# Patient Record
Sex: Male | Born: 1985 | Race: White | Hispanic: No | Marital: Single | Smoking: Current every day smoker
Health system: Southern US, Community
[De-identification: ages and names within clinical notes are randomized; demographics above are authoritative.]

## PROBLEM LIST (undated history)

## (undated) DIAGNOSIS — M5030 Other cervical disc degeneration, unspecified cervical region: Secondary | ICD-10-CM

## (undated) DIAGNOSIS — M5020 Other cervical disc displacement, unspecified cervical region: Secondary | ICD-10-CM

## (undated) DIAGNOSIS — T148XXA Other injury of unspecified body region, initial encounter: Secondary | ICD-10-CM

## (undated) HISTORY — PX: CERVICAL FUSION: SHX112

---

## 2004-05-02 ENCOUNTER — Emergency Department: Payer: Self-pay | Admitting: Emergency Medicine

## 2008-12-28 ENCOUNTER — Emergency Department: Payer: Self-pay | Admitting: Emergency Medicine

## 2009-06-15 ENCOUNTER — Emergency Department: Payer: Self-pay | Admitting: Internal Medicine

## 2009-09-22 ENCOUNTER — Emergency Department: Payer: Self-pay | Admitting: Emergency Medicine

## 2010-08-14 ENCOUNTER — Emergency Department: Payer: Self-pay | Admitting: Internal Medicine

## 2011-05-28 ENCOUNTER — Emergency Department: Payer: Self-pay | Admitting: Emergency Medicine

## 2011-10-16 ENCOUNTER — Emergency Department: Payer: Self-pay | Admitting: Emergency Medicine

## 2013-08-20 ENCOUNTER — Emergency Department: Payer: Self-pay | Admitting: Emergency Medicine

## 2013-08-23 ENCOUNTER — Emergency Department: Payer: Self-pay | Admitting: Emergency Medicine

## 2013-10-14 ENCOUNTER — Emergency Department: Payer: Self-pay | Admitting: Emergency Medicine

## 2013-10-14 LAB — URINALYSIS, COMPLETE
BACTERIA: NONE SEEN
Bilirubin,UR: NEGATIVE
Blood: NEGATIVE
Glucose,UR: NEGATIVE mg/dL (ref 0–75)
Ketone: NEGATIVE
LEUKOCYTE ESTERASE: NEGATIVE
Nitrite: NEGATIVE
PH: 5 (ref 4.5–8.0)
Protein: 30
RBC,UR: 1 /HPF (ref 0–5)
Specific Gravity: 1.032 (ref 1.003–1.030)

## 2013-10-14 LAB — GC/CHLAMYDIA PROBE AMP

## 2014-01-26 ENCOUNTER — Emergency Department: Payer: Self-pay | Admitting: Emergency Medicine

## 2014-01-26 LAB — URINALYSIS, COMPLETE
BILIRUBIN, UR: NEGATIVE
Bacteria: NONE SEEN
Blood: NEGATIVE
Glucose,UR: NEGATIVE mg/dL (ref 0–75)
KETONE: NEGATIVE
LEUKOCYTE ESTERASE: NEGATIVE
Nitrite: NEGATIVE
Ph: 5 (ref 4.5–8.0)
Protein: NEGATIVE
RBC,UR: NONE SEEN /HPF (ref 0–5)
SQUAMOUS EPITHELIAL: NONE SEEN
Specific Gravity: 1.008 (ref 1.003–1.030)

## 2014-03-25 ENCOUNTER — Emergency Department: Payer: Self-pay | Admitting: Emergency Medicine

## 2014-03-25 LAB — URINALYSIS, COMPLETE
BACTERIA: NONE SEEN
BLOOD: NEGATIVE
Bilirubin,UR: NEGATIVE
Ketone: NEGATIVE
Leukocyte Esterase: NEGATIVE
Nitrite: NEGATIVE
Ph: 5 (ref 4.5–8.0)
Protein: NEGATIVE
Specific Gravity: 1.012 (ref 1.003–1.030)
Squamous Epithelial: NONE SEEN
WBC UR: <1 /HPF (ref 0–5)

## 2014-04-04 ENCOUNTER — Emergency Department: Payer: Self-pay | Admitting: Emergency Medicine

## 2014-04-04 LAB — URINALYSIS, COMPLETE
BILIRUBIN, UR: NEGATIVE
Bacteria: NONE SEEN
Blood: NEGATIVE
Glucose,UR: NEGATIVE mg/dL (ref 0–75)
KETONE: NEGATIVE
Leukocyte Esterase: NEGATIVE
NITRITE: NEGATIVE
Ph: 5 (ref 4.5–8.0)
Protein: NEGATIVE
RBC,UR: <1 /HPF (ref 0–5)
Specific Gravity: 1.021 (ref 1.003–1.030)
WBC UR: <1 /HPF (ref 0–5)

## 2014-04-04 LAB — CBC WITH DIFFERENTIAL/PLATELET
Basophil #: 0.1 x10 3/mm 3
Basophil %: 0.9 %
Eosinophil #: 0.2 x10 3/mm 3
Eosinophil %: 2.2 %
HCT: 38.5 % — ABNORMAL LOW
HGB: 12.2 g/dL — ABNORMAL LOW
Lymphocyte %: 32.5 %
Lymphs Abs: 3 x10 3/mm 3
MCH: 20 pg — ABNORMAL LOW
MCHC: 31.6 g/dL — ABNORMAL LOW
MCV: 63 fL — ABNORMAL LOW
Monocyte #: 0.9 "x10 3/mm "
Monocyte %: 9.7 %
Neutrophil #: 5.1 x10 3/mm 3
Neutrophil %: 54.7 %
Platelet: 336 x10 3/mm 3
RBC: 6.1 x10 6/mm 3 — ABNORMAL HIGH
RDW: 14.1 %
WBC: 9.4 x10 3/mm 3

## 2014-04-04 LAB — COMPREHENSIVE METABOLIC PANEL
ALBUMIN: 4.3 g/dL (ref 3.4–5.0)
ALK PHOS: 94 U/L
ALT: 29 U/L
ANION GAP: 8 (ref 7–16)
BILIRUBIN TOTAL: 0.6 mg/dL (ref 0.2–1.0)
BUN: 8 mg/dL (ref 7–18)
CALCIUM: 8.7 mg/dL (ref 8.5–10.1)
CREATININE: 0.97 mg/dL (ref 0.60–1.30)
Chloride: 106 mmol/L (ref 98–107)
Co2: 26 mmol/L (ref 21–32)
EGFR (African American): >60
EGFR (Non-African Amer.): >60
Glucose: 78 mg/dL (ref 65–99)
OSMOLALITY: 277 (ref 275–301)
POTASSIUM: 2.9 mmol/L — AB (ref 3.5–5.1)
SGOT(AST): 20 U/L (ref 15–37)
Sodium: 140 mmol/L (ref 136–145)
Total Protein: 7.3 g/dL (ref 6.4–8.2)

## 2014-04-08 ENCOUNTER — Emergency Department: Payer: Self-pay | Admitting: Emergency Medicine

## 2014-04-08 LAB — URINALYSIS, COMPLETE
Bacteria: NONE SEEN
Bilirubin,UR: NEGATIVE
Blood: NEGATIVE
GLUCOSE, UR: NEGATIVE mg/dL (ref 0–75)
Ketone: NEGATIVE
Leukocyte Esterase: NEGATIVE
NITRITE: NEGATIVE
Ph: 6 (ref 4.5–8.0)
Protein: NEGATIVE
SPECIFIC GRAVITY: 1.011 (ref 1.003–1.030)
Squamous Epithelial: <1
WBC UR: 3 /HPF (ref 0–5)

## 2014-04-08 LAB — CBC WITH DIFFERENTIAL/PLATELET
Basophil #: 0.1 10*3/uL (ref 0.0–0.1)
Basophil %: 0.8 %
Eosinophil #: 0.2 10*3/uL (ref 0.0–0.7)
Eosinophil %: 1.7 %
HCT: 37.5 % — ABNORMAL LOW (ref 40.0–52.0)
HGB: 11.8 g/dL — ABNORMAL LOW (ref 13.0–18.0)
Lymphocyte #: 3.2 10*3/uL (ref 1.0–3.6)
Lymphocyte %: 29.6 %
MCH: 19.9 pg — AB (ref 26.0–34.0)
MCHC: 31.4 g/dL — ABNORMAL LOW (ref 32.0–36.0)
MCV: 63 fL — AB (ref 80–100)
MONO ABS: 0.6 x10 3/mm (ref 0.2–1.0)
MONOS PCT: 5.4 %
NEUTROS PCT: 62.5 %
Neutrophil #: 6.8 10*3/uL — ABNORMAL HIGH (ref 1.4–6.5)
PLATELETS: 350 10*3/uL (ref 150–440)
RBC: 5.93 10*6/uL — ABNORMAL HIGH (ref 4.40–5.90)
RDW: 14.3 % (ref 11.5–14.5)
WBC: 10.9 10*3/uL — AB (ref 3.8–10.6)

## 2014-04-08 LAB — COMPREHENSIVE METABOLIC PANEL
ALK PHOS: 81 U/L
Albumin: 4 g/dL (ref 3.4–5.0)
Anion Gap: 5 — ABNORMAL LOW (ref 7–16)
BUN: 8 mg/dL (ref 7–18)
Bilirubin,Total: 0.8 mg/dL (ref 0.2–1.0)
Calcium, Total: 8.6 mg/dL (ref 8.5–10.1)
Chloride: 107 mmol/L (ref 98–107)
Co2: 27 mmol/L (ref 21–32)
Creatinine: 0.91 mg/dL (ref 0.60–1.30)
EGFR (African American): >60
Glucose: 109 mg/dL — ABNORMAL HIGH (ref 65–99)
Osmolality: 276 (ref 275–301)
Potassium: 3.4 mmol/L — ABNORMAL LOW (ref 3.5–5.1)
SGOT(AST): 22 U/L (ref 15–37)
SGPT (ALT): 33 U/L
Sodium: 139 mmol/L (ref 136–145)
Total Protein: 6.9 g/dL (ref 6.4–8.2)

## 2015-02-14 ENCOUNTER — Emergency Department
Admission: EM | Admit: 2015-02-14 | Discharge: 2015-02-14 | Disposition: A | Discharge status: Home / Self Care | Payer: Self-pay | Attending: Emergency Medicine | Admitting: Emergency Medicine

## 2015-02-14 DIAGNOSIS — M546 Pain in thoracic spine: Secondary | ICD-10-CM | POA: Insufficient documentation

## 2015-02-14 DIAGNOSIS — M545 Low back pain, unspecified: Secondary | ICD-10-CM

## 2015-02-14 MED ORDER — CYCLOBENZAPRINE HCL 10 MG PO TABS: 10.0000 mg | ORAL_TABLET | Freq: Three times a day (TID) | ORAL | Status: DC | PRN

## 2015-02-14 MED ORDER — IBUPROFEN 800 MG PO TABS: 800.0000 mg | ORAL_TABLET | Freq: Three times a day (TID) | ORAL | Status: DC | PRN

## 2015-02-14 MED ORDER — PREDNISONE 10 MG PO TABS: 50.0000 mg | ORAL_TABLET | Freq: Every day | ORAL | Status: DC

## 2015-02-14 MED ORDER — KETOROLAC TROMETHAMINE 60 MG/2ML IM SOLN
60.0000 mg | Freq: Once | INTRAMUSCULAR | Status: AC
  Administered 2015-02-14: 60 mg via INTRAMUSCULAR
  Filled 2015-02-14: qty 2

## 2015-02-14 NOTE — ED Provider Notes (Signed)
[facility] Emergency Department Provider Note  ____________________________________________  Time seen: Approximately 2:04 PM  I have reviewed the triage vital signs and the nursing notes.   HISTORY  Chief Complaint Back Pain    HPI Craig Ellis is a 29 y.o. 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 States symptoms started over the last couple days but worse this morning. Denies any direct trauma to his back. Denies any urinary symptoms.   No past medical history on file.  There are no active problems to display for this patient.   No past surgical history on file.  Current Outpatient Rx  Name  Route  Sig  Dispense  Refill  . cyclobenzaprine (FLEXERIL) 10 MG tablet   Oral   Take 1 tablet (10 mg total) by mouth every 8 (eight) hours as needed for muscle spasms.   30 tablet   1   . ibuprofen (ADVIL,MOTRIN) 800 MG tablet   Oral   Take 1 tablet (800 mg total) by mouth every 8 (eight) hours as needed.   30 tablet   0   . predniSONE (DELTASONE) 10 MG tablet   Oral   Take 5 tablets (50 mg total) by mouth daily with breakfast.   25 tablet   0     Allergies Review of patient's allergies indicates not on file.  No family history on file.  Social History History  Substance Use Topics  . Smoking status: Not on file  . Smokeless tobacco: Not on file  . Alcohol Use: Not on file    Review of Systems Constitutional: No fever/chills Eyes: No visual changes. ENT: No sore throat. Cardiovascular: Denies chest pain. Respiratory: Denies shortness of breath. Gastrointestinal: No abdominal pain.  No nausea, no vomiting.  No diarrhea.  No constipation. Genitourinary: Negative for dysuria. Musculoskeletal: Positive for back pain.. Skin: Negative for rash. Neurological: Negative for headaches, focal weakness or numbness.  10-point ROS otherwise  negative.  ____________________________________________   PHYSICAL EXAM:  VITAL SIGNS: ED Triage Vitals  Enc Vitals Group     BP 02/14/15 1315 131/60 mmHg     Pulse Rate 02/14/15 1315 91     Resp 02/14/15 1315 18     Temp 02/14/15 1315 98.2 F (36.8 C)     Temp Source 02/14/15 1315 Oral     SpO2 02/14/15 1315 100 %     Weight 02/14/15 1315 140 lb (63.504 kg)     Height 02/14/15 1315  (1.651 m)     Head Cir --      Peak Flow --      Pain Score 02/14/15 1318 7     Pain Loc --      Pain Edu? --      Excl. in GC? --     Constitutional: Alert and oriented. Well appearing and in no acute distress. Eyes: Conjunctivae are normal. PERRL. EOMI. Head: Atraumatic. Nose: No congestion/rhinnorhea. Mouth/Throat: Mucous membranes are moist.  Oropharynx non-erythematous. Neck: No stridor.   Cardiovascular: Normal rate, regular rhythm. Grossly normal heart sounds.  Good peripheral circulation. Respiratory: Normal respiratory effort.  No retractions. Lungs CTAB. Gastrointestinal: Soft and nontender. No distention. No abdominal bruits. No CVA tenderness. Musculoskeletal: No lower extremity tenderness nor edema.  No joint effusions. Straight leg raise negative bilateral point tenderness noted to all paraspinal muscles in his back. Neurologic:  Normal speech and language. No gross focal neurologic deficits  are appreciated. No gait instability. Skin:  Skin is warm, dry and intact. No rash noted. Psychiatric: Mood and affect are normal. Speech and behavior are normal.  ____________________________________________   LABS (all labs ordered are listed, but only abnormal results are displayed)  Labs Reviewed - No data to display ____________________________________________  EKG  Deferred ____________________________________________  RADIOLOGY  Deferred ____________________________________________   PROCEDURES  Procedure(s) performed: None  Critical Care performed:  No  ____________________________________________   INITIAL IMPRESSION / ASSESSMENT AND PLAN / ED COURSE  Pertinent labs & imaging results that were available during my care of the patient were reviewed by me and considered in my medical decision making (see chart for details).  Lumbar sacral strain. Rx given for Flexeril, Motrin 800 mg, and prednisone 50 mg a day 5 days. Patient given a work note 1 day and to return to the ER with any worsening symptomology. At this time patient voices no other emergency medical complaints. ____________________________________________   FINAL CLINICAL IMPRESSION(S) / ED DIAGNOSES  Final diagnoses:  Back pain of thoracolumbar region      Evangeline DakinCharles M Beers, PA-C 02/14/15 1419  Darci Currentandolph N Brown, MD 02/16/15 [phone]

## 2015-02-14 NOTE — ED Notes (Signed)
Pt reports pain in "whole back" for past couple of days pt also states he lifts heavy weights at work. Denies injury

## 2015-02-14 NOTE — Discharge Instructions (Signed)
Back Pain, Adult Low back pain is very common. About 1 in 5 people have back pain.The cause of low back pain is rarely dangerous. The pain often gets better over time.About half of people with a sudden onset of back pain feel better in just 2 weeks. About 8 in 10 people feel better by 6 weeks.  CAUSES Some common causes of back pain include:  Strain of the muscles or ligaments supporting the spine.  Wear and tear (degeneration) of the spinal discs.  Arthritis.  Direct injury to the back. DIAGNOSIS Most of the time, the direct cause of low back pain is not known.However, back pain can be treated effectively even when the exact cause of the pain is unknown.Answering your caregiver's questions about your overall health and symptoms is one of the most accurate ways to make sure the cause of your pain is not dangerous. If your caregiver needs more information, he or she may order lab work or imaging tests (X-rays or MRIs).However, even if imaging tests show changes in your back, this usually does not require surgery. HOME CARE INSTRUCTIONS For many people, back pain returns.Since low back pain is rarely dangerous, it is often a condition that people can learn to [facility] their own.   Remain active. It is stressful on the back to sit or stand in one place. Do not sit, drive, or stand in one place for more than 30 minutes at a time. Take short walks on level surfaces as soon as pain allows.Try to increase the length of time you walk each day.  Do not stay in bed.Resting more than 1 or 2 days can delay your recovery.  Do not avoid exercise or work.Your body is made to move.It is not dangerous to be active, even though your back may hurt.Your back will likely heal faster if you return to being active before your pain is gone.  Pay attention to your body when you bend and lift. Many people have less discomfortwhen lifting if they bend their knees, keep the load close to their bodies,and  avoid twisting. Often, the most comfortable positions are those that put less stress on your recovering back.  Find a comfortable position to sleep. Use a firm mattress and lie on your side with your knees slightly bent. If you lie on your back, put a pillow under your knees.  Only take over-the-counter or prescription medicines as directed by your caregiver. Over-the-counter medicines to reduce pain and inflammation are often the most helpful.Your caregiver may prescribe muscle relaxant drugs.These medicines help dull your pain so you can more quickly return to your normal activities and healthy exercise.  Put ice on the injured area.  Put ice in a plastic bag.  Place a towel between your skin and the bag.  Leave the ice on for 15-20 minutes, 03-04 times a day for the first 2 to 3 days. After that, ice and heat may be alternated to reduce pain and spasms.  Ask your caregiver about trying back exercises and gentle massage. This may be of some benefit.  Avoid feeling anxious or stressed.Stress increases muscle tension and can worsen back pain.It is important to recognize when you are anxious or stressed and learn ways to manage it.Exercise is a great option. SEEK MEDICAL CARE IF:  You have pain that is not relieved with rest or medicine.  You have pain that does not improve in 1 week.  You have new symptoms.  You are generally not feeling well. SEEK  IMMEDIATE MEDICAL CARE IF:  °· You have pain that radiates from your back into your legs. °· You develop new bowel or bladder control problems. °· You have unusual weakness or numbness in your arms or legs. °· You develop nausea or vomiting. °· You develop abdominal pain. °· You feel faint. °Document Released: 07/17/2005 Document Revised: 01/16/2012 Document Reviewed: 11/18/2013 °ExitCare® Patient Information ©2015 [employer]. This information is not intended to replace advice given to you by your health care provider. Make sure you  discuss any questions you have with your health care provider. ° °Heat Therapy °Heat therapy can help ease sore, stiff, injured, and tight muscles and joints. Heat relaxes your muscles, which may help ease your pain.  °RISKS AND COMPLICATIONS °If you have any of the following conditions, do not use heat therapy unless your health care provider has approved: °· Poor circulation. °· Healing wounds or scarred skin in the area being treated. °· Diabetes, heart disease, or high blood pressure. °· Not being able to feel (numbness) the area being treated. °· Unusual swelling of the area being treated. °· Active infections. °· Blood clots. °· Cancer. °· Inability to communicate pain. This may include young children and people who have problems with their brain function (dementia). °· Pregnancy. °Heat therapy should only be used on old, pre-existing, or long-lasting (chronic) injuries. Do not use heat therapy on new injuries unless directed by your health care provider. °HOW TO USE HEAT THERAPY °There are several different kinds of heat therapy, including: °· Moist heat pack. °· Warm water bath. °· Hot water bottle. °· Electric heating pad. °· Heated gel pack. °· Heated wrap. °· Electric heating pad. °Use the heat therapy method suggested by your health care provider. Follow your health care provider's instructions on when and how to use heat therapy. °GENERAL HEAT THERAPY RECOMMENDATIONS °· Do not sleep while using heat therapy. Only use heat therapy while you are awake. °· Your skin may turn pink while using heat therapy. Do not use heat therapy if your skin turns red. °· Do not use heat therapy if you have new pain. °· High heat or long exposure to heat can cause burns. Be careful when using heat therapy to avoid burning your skin. °· Do not use heat therapy on areas of your skin that are already irritated, such as with a rash or sunburn. °SEEK MEDICAL CARE IF: °· You have blisters, redness, swelling, or numbness. °· You  have new pain. °· Your pain is worse. °MAKE SURE YOU: °· Understand these instructions. °· Will watch your condition. °· Will get help right away if you are not doing well or get worse. °Document Released: 10/09/2011 Document Revised: 12/01/2013 Document Reviewed: 09/09/2013 °ExitCare® Patient Information ©2015 [employer]. This information is not intended to replace advice given to you by your health care provider. Make sure you discuss any questions you have with your health care provider. ° °Musculoskeletal Pain °Musculoskeletal pain is muscle and boney aches and pains. These pains can occur in any part of the body. Your caregiver may treat you without knowing the cause of the pain. They may treat you if blood or urine tests, X-rays, and other tests were normal.  °CAUSES °There is often not a definite cause or reason for these pains. These pains may be caused by a type of germ (virus). The discomfort may also come from overuse. Overuse includes working out too hard when your body is not fit. Boney aches also come from   weather changes. Bone is sensitive to atmospheric pressure changes. HOME CARE INSTRUCTIONS   Ask when your test results will be ready. Make sure you get your test results.  Only take over-the-counter or prescription medicines for pain, discomfort, or fever as directed by your caregiver. If you were given medications for your condition, do not drive, operate machinery or power tools, or sign legal documents for 24 hours. Do not drink alcohol. Do not take sleeping pills or other medications that may interfere with treatment.  Continue all activities unless the activities cause more pain. When the pain lessens, slowly resume normal activities. Gradually increase the intensity and duration of the activities or exercise.  During periods of severe pain, bed rest may be helpful. Lay or sit in any position that is comfortable.  Putting ice on the injured area.  Put ice in a bag.  Place a  towel between your skin and the bag.  Leave the ice on for 15 to 20 minutes, 3 to 4 times a day.  Follow up with your caregiver for continued problems and no reason can be found for the pain. If the pain becomes worse or does not go away, it may be necessary to repeat tests or do additional testing. Your caregiver may need to look further for a possible cause. SEEK IMMEDIATE MEDICAL CARE IF:  You have pain that is getting worse and is not relieved by medications.  You develop chest pain that is associated with shortness or breath, sweating, feeling sick to your stomach (nauseous), or throw up (vomit).  Your pain becomes localized to the abdomen.  You develop any new symptoms that seem different or that concern you. MAKE SURE YOU:   Understand these instructions.  Will watch your condition.  Will get help right away if you are not doing well or get worse. Document Released: 07/17/2005 Document Revised: 10/09/2011 Document Reviewed: 03/21/2013 [facility] LP[facility] Patient Information 2015 [place], [place]. This information is not intended to replace advice given to you by your health care provider. Make sure you discuss any questions you have with your health care provider.

## 2015-02-14 NOTE — ED Notes (Signed)
NAD noted at time of D/C. Pt refused wheelchair to the lobby.  

## 2015-03-11 ENCOUNTER — Emergency Department
Admission: EM | Admit: 2015-03-11 | Discharge: 2015-03-11 | Discharge status: Against Medical Advice | Payer: Self-pay | Attending: Emergency Medicine | Admitting: Emergency Medicine

## 2015-03-11 ENCOUNTER — Emergency Department: Payer: Self-pay

## 2015-03-11 ENCOUNTER — Telehealth: Payer: Self-pay | Admitting: Emergency Medicine

## 2015-03-11 DIAGNOSIS — Z7952 Long term (current) use of systemic steroids: Secondary | ICD-10-CM | POA: Insufficient documentation

## 2015-03-11 DIAGNOSIS — J181 Lobar pneumonia, unspecified organism: Secondary | ICD-10-CM

## 2015-03-11 DIAGNOSIS — J189 Pneumonia, unspecified organism: Secondary | ICD-10-CM | POA: Insufficient documentation

## 2015-03-11 DIAGNOSIS — Z720 Tobacco use: Secondary | ICD-10-CM | POA: Insufficient documentation

## 2015-03-11 MED ORDER — TRAMADOL HCL 50 MG PO TABS
50.0000 mg | ORAL_TABLET | Freq: Once | ORAL | Status: AC
  Administered 2015-03-11: 50 mg via ORAL
  Filled 2015-03-11: qty 1

## 2015-03-11 NOTE — ED Notes (Signed)
Sore throat and cough for couple of days   Cough is non prod.Marland Kitchen

## 2015-03-11 NOTE — ED Notes (Signed)
Also having back pain  Which states this started about 3 weeks ago.Marland Kitchenambulates well to treatment room

## 2015-03-11 NOTE — ED Notes (Signed)
Pt c/o sore throat with cough for the past 2-3 days

## 2015-03-11 NOTE — ED Provider Notes (Signed)
[facility] Emergency Department Provider Note  ____________________________________________  Time seen: Approximately 1:09 PM  I have reviewed the triage vital signs and the nursing notes.   HISTORY  Chief Complaint Cough and Sore Throat   HPI Craig Ellis is a 29 y.o. male presents to the emergency department for evaluation of sore throat, cough, and back pain. He states that the cough has been productive for the past 2 days. He states that he was evaluated here a few days ago but the medications that were prescribed are not helping with the back pain. She denies fever, chills, shortness of breath.   History reviewed. No pertinent past medical history.  There are no active problems to display for this patient.   History reviewed. No pertinent past surgical history.  Current Outpatient Rx  Name  Route  Sig  Dispense  Refill  . cyclobenzaprine (FLEXERIL) 10 MG tablet   Oral   Take 1 tablet (10 mg total) by mouth every 8 (eight) hours as needed for muscle spasms.   30 tablet   1   . ibuprofen (ADVIL,MOTRIN) 800 MG tablet   Oral   Take 1 tablet (800 mg total) by mouth every 8 (eight) hours as needed.   30 tablet   0   . predniSONE (DELTASONE) 10 MG tablet   Oral   Take 5 tablets (50 mg total) by mouth daily with breakfast.   25 tablet   0     Allergies Iodine  No family history on file.  Social History Social History  Substance Use Topics  . Smoking status: Current Every Day Smoker -- 1.00 packs/day    Types: Cigarettes  . Smokeless tobacco: None  . Alcohol Use: No    Review of Systems Constitutional:Feverno Eyes: No visual changes. ENT: Sore throat.yes, Difficulty Swallowing no Respiratory: Denies shortness of breath. Gastrointestinal: No abdominal pain.  No nausea, no vomiting.  No diarrhea. Genitourinary: Negative for dysuria. Musculoskeletal:Generalized body aches: no Skin: Rash: no  Neurological: Negative for  headaches, focal weakness or numbness.  10-point ROS otherwise negative.  ____________________________________________   PHYSICAL EXAM:  VITAL SIGNS: ED Triage Vitals  Enc Vitals Group     BP 03/11/15 1200 128/74 mmHg     Pulse Rate 03/11/15 1200 107     Resp 03/11/15 1200 18     Temp 03/11/15 1200 98.9 F (37.2 C)     Temp Source 03/11/15 1200 Oral     SpO2 03/11/15 1200 100 %     Weight 03/11/15 1200 140 lb (63.504 kg)     Height 03/11/15 1200  (1.651 m)     Head Cir --      Peak Flow --      Pain Score 03/11/15 1202 8     Pain Loc --      Pain Edu? --      Excl. in GC? --     Constitutional: Alert and oriented. Well appearing and in no acute distress. Eyes: Conjunctivae are normal. PERRL. EOMI. Head: Atraumatic. Nose: No congestion/rhinnorhea. Mouth/Throat: Mucous membranes are moist.  Oropharynx non-erythematous. Neck: No stridor.  Lymphatic: Lymphadenopathy: no Cardiovascular: Normal rate, regular rhythm. Good peripheral circulation. Respiratory: Normal respiratory effort. Lungs: faint expiratory wheeze present in LLL. Gastrointestinal: Soft and nontender. Musculoskeletal: No lower extremity tenderness nor edema.   Neurologic:  Normal speech and language. No gross focal neurologic deficits are appreciated. Speech is normal. No gait instability. Skin:  Skin is warm, dry and intact.  No rash noted Psychiatric: Mood and affect are normal. Speech and behavior are normal.  ____________________________________________   LABS (all labs ordered are listed, but only abnormal results are displayed)  Labs Reviewed - No data to display ____________________________________________  EKG   ____________________________________________  RADIOLOGY  Chest x-ray shows some bronchitic changes with a left lower lobe pneumonia. ____________________________________________   PROCEDURES  Procedure(s) performed: None  Critical Care performed:  No  ____________________________________________   INITIAL IMPRESSION / ASSESSMENT AND PLAN / ED COURSE  Pertinent labs & imaging results that were available during my care of the patient were reviewed by me and considered in my medical decision making (see chart for details).  Patient was not in the room to discuss his diagnosis. Multiple attempts were made by the nursing staff to contact him by phone without success. He will be mailed a letter and advised to return to the emergency department.. ____________________________________________   FINAL CLINICAL IMPRESSION(S) / ED DIAGNOSES  Final diagnoses:  Left lower lobe pneumonia      Chinita Pester, FNP 03/11/15 1717  Chinita Pester, FNP 03/11/15 1720  Emily Filbert, MD 03/14/15 [phone]

## 2015-03-11 NOTE — ED Notes (Signed)
Back from xray

## 2015-03-11 NOTE — ED Notes (Signed)
Not in room for x-ray results.  PA aware

## 2015-07-12 ENCOUNTER — Emergency Department: Payer: Self-pay

## 2015-07-12 ENCOUNTER — Emergency Department
Admission: EM | Admit: 2015-07-12 | Discharge: 2015-07-12 | Disposition: A | Discharge status: Home / Self Care | Payer: Self-pay | Attending: Emergency Medicine | Admitting: Emergency Medicine

## 2015-07-12 ENCOUNTER — Encounter: Payer: Self-pay | Admitting: Emergency Medicine

## 2015-07-12 DIAGNOSIS — T148XXA Other injury of unspecified body region, initial encounter: Secondary | ICD-10-CM

## 2015-07-12 DIAGNOSIS — M545 Low back pain, unspecified: Secondary | ICD-10-CM

## 2015-07-12 DIAGNOSIS — M5412 Radiculopathy, cervical region: Secondary | ICD-10-CM | POA: Insufficient documentation

## 2015-07-12 DIAGNOSIS — S4991XA Unspecified injury of right shoulder and upper arm, initial encounter: Secondary | ICD-10-CM | POA: Insufficient documentation

## 2015-07-12 DIAGNOSIS — Y9389 Activity, other specified: Secondary | ICD-10-CM | POA: Insufficient documentation

## 2015-07-12 DIAGNOSIS — M898X1 Other specified disorders of bone, shoulder: Secondary | ICD-10-CM

## 2015-07-12 DIAGNOSIS — S39012A Strain of muscle, fascia and tendon of lower back, initial encounter: Secondary | ICD-10-CM | POA: Insufficient documentation

## 2015-07-12 DIAGNOSIS — Z791 Long term (current) use of non-steroidal anti-inflammatories (NSAID): Secondary | ICD-10-CM | POA: Insufficient documentation

## 2015-07-12 DIAGNOSIS — F17210 Nicotine dependence, cigarettes, uncomplicated: Secondary | ICD-10-CM | POA: Insufficient documentation

## 2015-07-12 DIAGNOSIS — W1789XA Other fall from one level to another, initial encounter: Secondary | ICD-10-CM | POA: Insufficient documentation

## 2015-07-12 DIAGNOSIS — Y990 Civilian activity done for income or pay: Secondary | ICD-10-CM | POA: Insufficient documentation

## 2015-07-12 DIAGNOSIS — S8001XA Contusion of right knee, initial encounter: Secondary | ICD-10-CM | POA: Insufficient documentation

## 2015-07-12 DIAGNOSIS — Y9289 Other specified places as the place of occurrence of the external cause: Secondary | ICD-10-CM | POA: Insufficient documentation

## 2015-07-12 DIAGNOSIS — M542 Cervicalgia: Secondary | ICD-10-CM

## 2015-07-12 DIAGNOSIS — S199XXA Unspecified injury of neck, initial encounter: Secondary | ICD-10-CM | POA: Insufficient documentation

## 2015-07-12 DIAGNOSIS — Z7952 Long term (current) use of systemic steroids: Secondary | ICD-10-CM | POA: Insufficient documentation

## 2015-07-12 DIAGNOSIS — S5001XA Contusion of right elbow, initial encounter: Secondary | ICD-10-CM | POA: Insufficient documentation

## 2015-07-12 MED ORDER — HYDROCODONE-ACETAMINOPHEN 5-325 MG PO TABS
1.0000 | ORAL_TABLET | ORAL | Status: AC
  Administered 2015-07-12: 1 via ORAL
  Filled 2015-07-12: qty 1

## 2015-07-12 MED ORDER — CYCLOBENZAPRINE HCL 5 MG PO TABS
5.0000 mg | ORAL_TABLET | Freq: Three times a day (TID) | ORAL | Status: DC | PRN
Start: 2015-07-12 — End: 2015-11-01

## 2015-07-12 MED ORDER — MELOXICAM 15 MG PO TABS: 15.0000 mg | ORAL_TABLET | Freq: Every day | ORAL | Status: DC

## 2015-07-12 MED ORDER — HYDROCODONE-ACETAMINOPHEN 5-325 MG PO TABS: 1.0000 | ORAL_TABLET | Freq: Four times a day (QID) | ORAL | Status: DC | PRN

## 2015-07-12 NOTE — ED Notes (Addendum)
Pt presents to ER with back pain with right side leg pain due to a fall from 15 ft at work Friday afternoon. Pt denies Worker's compensation. Pt states he has had intermittent numbness and tingling since. Pt has limited movement since then.

## 2015-07-12 NOTE — ED Notes (Signed)
C-collar applied

## 2015-07-12 NOTE — Discharge Instructions (Signed)
Back Pain, Adult °Back pain is very common in adults. The cause of back pain is rarely dangerous and the pain often gets better over time. The cause of your back pain may not be known. Some common causes of back pain include: °· Strain of the muscles or ligaments supporting the spine. °· Wear and tear (degeneration) of the spinal disks. °· Arthritis. °· Direct injury to the back. °For many people, back pain may return. Since back pain is rarely dangerous, most people can learn to manage this condition on their own. °HOME CARE INSTRUCTIONS °Watch your back pain for any changes. The following actions may help to lessen any discomfort you are feeling: °· Remain active. It is stressful on your back to sit or stand in one place for long periods of time. Do not sit, drive, or stand in one place for more than 30 minutes at a time. Take short walks on even surfaces as soon as you are able. Try to increase the length of time you walk each day. °· Exercise regularly as directed by your health care provider. Exercise helps your back heal faster. It also helps avoid future injury by keeping your muscles strong and flexible. °· Do not stay in bed. Resting more than 1-2 days can delay your recovery. °· Pay attention to your body when you bend and lift. The most comfortable positions are those that put less stress on your recovering back. Always use proper lifting techniques, including: °¨ Bending your knees. °¨ Keeping the load close to your body. °¨ Avoiding twisting. °· Find a comfortable position to sleep. Use a firm mattress and lie on your side with your knees slightly bent. If you lie on your back, put a pillow under your knees. °· Avoid feeling anxious or stressed. Stress increases muscle tension and can worsen back pain. It is important to recognize when you are anxious or stressed and learn ways to manage it, such as with exercise. °· Take medicines only as directed by your health care provider. Over-the-counter  medicines to reduce pain and inflammation are often the most helpful. Your health care provider may prescribe muscle relaxant drugs. These medicines help dull your pain so you can more quickly return to your normal activities and healthy exercise. °· Apply ice to the injured area: °¨ Put ice in a plastic bag. °¨ Place a towel between your skin and the bag. °¨ Leave the ice on for 20 minutes, 2-3 times a day for the first 2-3 days. After that, ice and heat may be alternated to reduce pain and spasms. °· Maintain a healthy weight. Excess weight puts extra stress on your back and makes it difficult to maintain good posture. °SEEK MEDICAL CARE IF: °· You have pain that is not relieved with rest or medicine. °· You have increasing pain going down into the legs or buttocks. °· You have pain that does not improve in one week. °· You have night pain. °· You lose weight. °· You have a fever or chills. °SEEK IMMEDIATE MEDICAL CARE IF:  °· You develop new bowel or bladder control problems. °· You have unusual weakness or numbness in your arms or legs. °· You develop nausea or vomiting. °· You develop abdominal pain. °· You feel faint. °  °This information is not intended to replace advice given to you by your health care provider. Make sure you discuss any questions you have with your health care provider. °  °Document Released: 07/17/2005 Document Revised: 08/07/2014 Document Reviewed: 11/18/2013 °Elsevier Interactive Patient Education ©2016 [employer]  Cervical Strain and Sprain With Rehab Cervical strain and sprain are injuries that commonly occur with "whiplash" injuries. Whiplash occurs when the neck is forcefully whipped backward or forward, such as during a motor vehicle accident or during contact sports. The muscles, ligaments, tendons, discs, and nerves of the neck are susceptible to injury when this occurs. RISK FACTORS Risk of having a whiplash injury increases if:  Osteoarthritis of the  spine.  Situations that make head or neck accidents or trauma more likely.  High-risk sports (football, rugby, wrestling, hockey, auto racing, gymnastics, diving, contact karate, or boxing).  Poor strength and flexibility of the neck.  Previous neck injury.  Poor tackling technique.  Improperly fitted or padded equipment. SYMPTOMS   Pain or stiffness in the front or back of neck or both.  Symptoms may present immediately or up to 24 hours after injury.  Dizziness, headache, nausea, and vomiting.  Muscle spasm with soreness and stiffness in the neck.  Tenderness and swelling at the injury site. PREVENTION  Learn and use proper technique (avoid tackling with the head, spearing, and head-butting; use proper falling techniques to avoid landing on the head).  Warm up and stretch properly before activity.  Maintain physical fitness:  Strength, flexibility, and endurance.  Cardiovascular fitness.  Wear properly fitted and padded protective equipment, such as padded soft collars, for participation in contact sports. PROGNOSIS  Recovery from cervical strain and sprain injuries is dependent on the extent of the injury. These injuries are usually curable in 1 week to 3 months with appropriate treatment.  RELATED COMPLICATIONS   Temporary numbness and weakness may occur if the nerve roots are damaged, and this may persist until the nerve has completely healed.  Chronic pain due to frequent recurrence of symptoms.  Prolonged healing, especially if activity is resumed too soon (before complete recovery). TREATMENT  Treatment initially involves the use of ice and medication to help reduce pain and inflammation. It is also important to perform strengthening and stretching exercises and modify activities that worsen symptoms so the injury does not get worse. These exercises may be performed at home or with a therapist. For patients who experience severe symptoms, a soft, padded collar  may be recommended to be worn around the neck.  Improving your posture may help reduce symptoms. Posture improvement includes pulling your chin and abdomen in while sitting or standing. If you are sitting, sit in a firm chair with your buttocks against the back of the chair. While sleeping, try replacing your pillow with a small towel rolled to 2 inches in diameter, or use a cervical pillow or soft cervical collar. Poor sleeping positions delay healing.  For patients with nerve root damage, which causes numbness or weakness, the use of a cervical traction apparatus may be recommended. Surgery is rarely necessary for these injuries. However, cervical strain and sprains that are present at birth (congenital) may require surgery. MEDICATION   If pain medication is necessary, nonsteroidal anti-inflammatory medications, such as aspirin and ibuprofen, or other minor pain relievers, such as acetaminophen, are often recommended.  Do not take pain medication for 7 days before surgery.  Prescription pain relievers may be given if deemed necessary by your caregiver. Use only as directed and only as much as you need. HEAT AND COLD:   Cold treatment (icing) relieves pain and reduces inflammation. Cold treatment should be applied for 10 to 15 minutes every 2 to 3 hours for inflammation and pain and immediately after any activity  that aggravates your symptoms. Use ice packs or an ice massage.  Heat treatment may be used prior to performing the stretching and strengthening activities prescribed by your caregiver, physical therapist, or athletic trainer. Use a heat pack or a warm soak. SEEK MEDICAL CARE IF:   Symptoms get worse or do not improve in 2 weeks despite treatment.  New, unexplained symptoms develop (drugs used in treatment may produce side effects). EXERCISES RANGE OF MOTION (ROM) AND STRETCHING EXERCISES - Cervical Strain and Sprain These exercises may help you when beginning to rehabilitate your  injury. In order to successfully resolve your symptoms, you must improve your posture. These exercises are designed to help reduce the forward-head and rounded-shoulder posture which contributes to this condition. Your symptoms may resolve with or without further involvement from your physician, physical therapist or athletic trainer. While completing these exercises, remember:   Restoring tissue flexibility helps normal motion to return to the joints. This allows healthier, less painful movement and activity.  An effective stretch should be held for at least 20 seconds, although you may need to begin with shorter hold times for comfort.  A stretch should never be painful. You should only feel a gentle lengthening or release in the stretched tissue. STRETCH- Axial Extensors  Lie on your back on the floor. You may bend your knees for comfort. Place a rolled-up hand towel or dish towel, about 2 inches in diameter, under the part of your head that makes contact with the floor.  Gently tuck your chin, as if trying to make a "double chin," until you feel a gentle stretch at the base of your head.  Hold __________ seconds. Repeat __________ times. Complete this exercise __________ times per day.  STRETCH - Axial Extension   Stand or sit on a firm surface. Assume a good posture: chest up, shoulders drawn back, abdominal muscles slightly tense, knees unlocked (if standing) and feet hip width apart.  Slowly retract your chin so your head slides back and your chin slightly lowers. Continue to look straight ahead.  You should feel a gentle stretch in the back of your head. Be certain not to feel an aggressive stretch since this can cause headaches later.  Hold for __________ seconds. Repeat __________ times. Complete this exercise __________ times per day. STRETCH - Cervical Side Bend   Stand or sit on a firm surface. Assume a good posture: chest up, shoulders drawn back, abdominal muscles slightly  tense, knees unlocked (if standing) and feet hip width apart.  Without letting your nose or shoulders move, slowly tip your right / left ear to your shoulder until your feel a gentle stretch in the muscles on the opposite side of your neck.  Hold __________ seconds. Repeat __________ times. Complete this exercise __________ times per day. STRETCH - Cervical Rotators   Stand or sit on a firm surface. Assume a good posture: chest up, shoulders drawn back, abdominal muscles slightly tense, knees unlocked (if standing) and feet hip width apart.  Keeping your eyes level with the ground, slowly turn your head until you feel a gentle stretch along the back and opposite side of your neck.  Hold __________ seconds. Repeat __________ times. Complete this exercise __________ times per day. RANGE OF MOTION - Neck Circles   Stand or sit on a firm surface. Assume a good posture: chest up, shoulders drawn back, abdominal muscles slightly tense, knees unlocked (if standing) and feet hip width apart.  Gently roll your head down and around  from the back of one shoulder to the back of the other. The motion should never be forced or painful.  Repeat the motion 10-20 times, or until you feel the neck muscles relax and loosen. Repeat __________ times. Complete the exercise __________ times per day. STRENGTHENING EXERCISES - Cervical Strain and Sprain These exercises may help you when beginning to rehabilitate your injury. They may resolve your symptoms with or without further involvement from your physician, physical therapist, or athletic trainer. While completing these exercises, remember:   Muscles can gain both the endurance and the strength needed for everyday activities through controlled exercises.  Complete these exercises as instructed by your physician, physical therapist, or athletic trainer. Progress the resistance and repetitions only as guided.  You may experience muscle soreness or fatigue, but  the pain or discomfort you are trying to eliminate should never worsen during these exercises. If this pain does worsen, stop and make certain you are following the directions exactly. If the pain is still present after adjustments, discontinue the exercise until you can discuss the trouble with your clinician. STRENGTH - Cervical Flexors, Isometric  Face a wall, standing about 6 inches away. Place a small pillow, a ball about 6-8 inches in diameter, or a folded towel between your forehead and the wall.  Slightly tuck your chin and gently push your forehead into the soft object. Push only with mild to moderate intensity, building up tension gradually. Keep your jaw and forehead relaxed.  Hold 10 to 20 seconds. Keep your breathing relaxed.  Release the tension slowly. Relax your neck muscles completely before you start the next repetition. Repeat __________ times. Complete this exercise __________ times per day. STRENGTH- Cervical Lateral Flexors, Isometric   Stand about 6 inches away from a wall. Place a small pillow, a ball about 6-8 inches in diameter, or a folded towel between the side of your head and the wall.  Slightly tuck your chin and gently tilt your head into the soft object. Push only with mild to moderate intensity, building up tension gradually. Keep your jaw and forehead relaxed.  Hold 10 to 20 seconds. Keep your breathing relaxed.  Release the tension slowly. Relax your neck muscles completely before you start the next repetition. Repeat __________ times. Complete this exercise __________ times per day. STRENGTH - Cervical Extensors, Isometric   Stand about 6 inches away from a wall. Place a small pillow, a ball about 6-8 inches in diameter, or a folded towel between the back of your head and the wall.  Slightly tuck your chin and gently tilt your head back into the soft object. Push only with mild to moderate intensity, building up tension gradually. Keep your jaw and  forehead relaxed.  Hold 10 to 20 seconds. Keep your breathing relaxed.  Release the tension slowly. Relax your neck muscles completely before you start the next repetition. Repeat __________ times. Complete this exercise __________ times per day. POSTURE AND BODY MECHANICS CONSIDERATIONS - Cervical Strain and Sprain Keeping correct posture when sitting, standing or completing your activities will reduce the stress put on different body tissues, allowing injured tissues a chance to heal and limiting painful experiences. The following are general guidelines for improved posture. Your physician or physical therapist will provide you with any instructions specific to your needs. While reading these guidelines, remember:  The exercises prescribed by your provider will help you have the flexibility and strength to maintain correct postures.  The correct posture provides the optimal environment for your  joints to work. All of your joints have less wear and tear when properly supported by a spine with good posture. This means you will experience a healthier, less painful body.  Correct posture must be practiced with all of your activities, especially prolonged sitting and standing. Correct posture is as important when doing repetitive low-stress activities (typing) as it is when doing a single heavy-load activity (lifting). PROLONGED STANDING WHILE SLIGHTLY LEANING FORWARD When completing a task that requires you to lean forward while standing in one place for a long time, place either foot up on a stationary 2- to 4-inch high object to help maintain the best posture. When both feet are on the ground, the low back tends to lose its slight inward curve. If this curve flattens (or becomes too large), then the back and your other joints will experience too much stress, fatigue more quickly, and can cause pain.  RESTING POSITIONS Consider which positions are most painful for you when choosing a resting  position. If you have pain with flexion-based activities (sitting, bending, stooping, squatting), choose a position that allows you to rest in a less flexed posture. You would want to avoid curling into a fetal position on your side. If your pain worsens with extension-based activities (prolonged standing, working overhead), avoid resting in an extended position such as sleeping on your stomach. Most people will find more comfort when they rest with their spine in a more neutral position, neither too rounded nor too arched. Lying on a non-sagging bed on your side with a pillow between your knees, or on your back with a pillow under your knees will often provide some relief. Keep in mind, being in any one position for a prolonged period of time, no matter how correct your posture, can still lead to stiffness. WALKING Walk with an upright posture. Your ears, shoulders, and hips should all line up. OFFICE WORK When working at a desk, create an environment that supports good, upright posture. Without extra support, muscles fatigue and lead to excessive strain on joints and other tissues. CHAIR:  A chair should be able to slide under your desk when your back makes contact with the back of the chair. This allows you to work closely.  The chair's height should allow your eyes to be level with the upper part of your monitor and your hands to be slightly lower than your elbows.  Body position:  Your feet should make contact with the floor. If this is not possible, use a foot rest.  Keep your ears over your shoulders. This will reduce stress on your neck and low back.   This information is not intended to replace advice given to you by your health care provider. Make sure you discuss any questions you have with your health care provider.   Document Released: 07/17/2005 Document Revised: 08/07/2014 Document Reviewed: 10/29/2008 Elsevier Interactive Patient Education 2016 [employer]  Cervical  Radiculopathy Cervical radiculopathy happens when a nerve in the neck (cervical nerve) is pinched or bruised. This condition can develop because of an injury or as part of the normal aging process. Pressure on the cervical nerves can cause pain or numbness that runs from the neck all the way down into the arm and fingers. Usually, this condition gets better with rest. Treatment may be needed if the condition does not improve.  CAUSES This condition may be caused by:  Injury.  Slipped (herniated) disk.  Muscle tightness in the neck because of overuse.  Arthritis.  Breakdown or degeneration in the bones and joints of the spine (spondylosis) due to aging.  Bone spurs that may develop near the cervical nerves. SYMPTOMS Symptoms of this condition include:  Pain that runs from the neck to the arm and hand. The pain can be severe or irritating. It may be worse when the neck is moved.  Numbness or weakness in the affected arm and hand. DIAGNOSIS This condition may be diagnosed based on symptoms, medical history, and a physical exam. You may also have tests, including:  X-rays.  CT scan.  MRI.  Electromyogram (EMG).  Nerve conduction tests. TREATMENT In many cases, treatment is not needed for this condition. With rest, the condition usually gets better over time. If treatment is needed, options may include:  Wearing a soft neck collar for short periods of time.  Physical therapy to strengthen your neck muscles.  Medicines, such as NSAIDs, oral corticosteroids, or spinal injections.  Surgery. This may be needed if other treatments do not help. Various types of surgery may be done depending on the cause of your problems. HOME CARE INSTRUCTIONS Managing Pain  Take over-the-counter and prescription medicines only as told by your health care provider.  If directed, apply ice to the affected area.  Put ice in a plastic bag.  Place a towel between your skin and the bag.  Leave  the ice on for 20 minutes, 2-3 times per day.  If ice does not help, you can try using heat. Take a warm shower or warm bath, or use a heat pack as told by your health care provider.  Try a gentle neck and shoulder massage to help relieve symptoms. Activity  Rest as needed. Follow instructions from your health care provider about any restrictions on activities.  Do stretching and strengthening exercises as told by your health care provider or physical therapist. General Instructions  If you were given a soft collar, wear it as told by your health care provider.  Use a flat pillow when you sleep.  Keep all follow-up visits as told by your health care provider. This is important. SEEK MEDICAL CARE IF:  Your condition does not improve with treatment. SEEK IMMEDIATE MEDICAL CARE IF:  Your pain gets much worse and cannot be controlled with medicines.  You have weakness or numbness in your hand, arm, face, or leg.  You have a high fever.  You have a stiff, rigid neck.  You lose control of your bowels or your bladder (have incontinence).  You have trouble with walking, balance, or speaking.   This information is not intended to replace advice given to you by your health care provider. Make sure you discuss any questions you have with your health care provider.   Document Released: 04/11/2001 Document Revised: 04/07/2015 Document Reviewed: 09/10/2014 Elsevier Interactive Patient Education 2016 [employer]  Contusion A contusion is a deep bruise. Contusions are the result of a blunt injury to tissues and muscle fibers under the skin. The injury causes bleeding under the skin. The skin overlying the contusion may turn blue, purple, or yellow. Minor injuries will give you a painless contusion, but more severe contusions may stay painful and swollen for a few weeks.  CAUSES  This condition is usually caused by a blow, trauma, or direct force to an area of the body. SYMPTOMS   Symptoms of this condition include:  Swelling of the injured area.  Pain and tenderness in the injured area.  Discoloration. The area may have redness and  then turn blue, purple, or yellow. DIAGNOSIS  This condition is diagnosed based on a physical exam and medical history. An X-ray, CT scan, or MRI may be needed to determine if there are any associated injuries, such as broken bones (fractures). TREATMENT  Specific treatment for this condition depends on what area of the body was injured. In general, the best treatment for a contusion is resting, icing, applying pressure to (compression), and elevating the injured area. This is often called the RICE strategy. Over-the-counter anti-inflammatory medicines may also be recommended for pain control.  HOME CARE INSTRUCTIONS   Rest the injured area.  If directed, apply ice to the injured area:  Put ice in a plastic bag.  Place a towel between your skin and the bag.  Leave the ice on for 20 minutes, 2-3 times per day.  If directed, apply light compression to the injured area using an elastic bandage. Make sure the bandage is not wrapped too tightly. Remove and reapply the bandage as directed by your health care provider.  If possible, raise (elevate) the injured area above the level of your heart while you are sitting or lying down.  Take over-the-counter and prescription medicines only as told by your health care provider. SEEK MEDICAL CARE IF:  Your symptoms do not improve after several days of treatment.  Your symptoms get worse.  You have difficulty moving the injured area. SEEK IMMEDIATE MEDICAL CARE IF:   You have severe pain.  You have numbness in a hand or foot.  Your hand or foot turns pale or cold.   This information is not intended to replace advice given to you by your health care provider. Make sure you discuss any questions you have with your health care provider.   Document Released: 04/26/2005 Document Revised:  04/07/2015 Document Reviewed: 12/02/2014 Elsevier Interactive Patient Education [employer]

## 2015-07-12 NOTE — ED Notes (Signed)
Pt states he fell 315ft from an extension ladder on Friday and is having pain to the whole back and neck, states he is having numbness to the right arm.. c-collar applied in triage.

## 2015-07-12 NOTE — ED Provider Notes (Signed)
CSN: [id number]     Arrival date & time 07/12/15  1627 History   First MD Initiated Contact with Patient 07/12/15 1713     Chief Complaint  Patient presents with  . Back Pain  . Leg Pain     (Consider location/radiation/quality/duration/timing/severity/associated sxs/prior Treatment) HPI  29 year old male presents to emergency department for evaluation of neck, thoracic, lumbar, right shoulder, right elbow, right knee pain. Patient was at work on the ninth 2016, fell approximately 15 feet and landed on his right side. He denies any head trauma, headache, loss of consciousness, nausea, vomiting. She was able to ambulate immediately after the accident but was sore mostly in his right shoulder. He has moderate to severe pain in the right shoulder, right elbow, right knee. He describes the pain as sharp with movement. He also has tightness in the cervical thoracic and lumbar spine. As any hip or groin pain. No abdominal pain chest pain or shortness of breath. Yesterday, patient experienced numbness and tingling that lasted 2 hours and his right upper extremity. He had a brief episode of weakness in the right upper extremity. Patient denies any numbness or tingling or weakness in the upper or lower extremities today. He has not been taking any medications for pain.  History reviewed. No pertinent past medical history. History reviewed. No pertinent past surgical history. History reviewed. No pertinent family history. Social History  Substance Use Topics  . Smoking status: Current Every Day Smoker -- 1.00 packs/day    Types: Cigarettes  . Smokeless tobacco: None  . Alcohol Use: No    Review of Systems  Constitutional: Negative.  Negative for fever, chills, activity change and appetite change.  HENT: Negative for congestion, ear pain, mouth sores, rhinorrhea, sinus pressure, sore throat and trouble swallowing.   Eyes: Negative for photophobia, pain and discharge.  Respiratory: Negative for  cough, chest tightness and shortness of breath.   Cardiovascular: Negative for chest pain and leg swelling.  Gastrointestinal: Negative for nausea, vomiting, abdominal pain, diarrhea and abdominal distention.  Genitourinary: Negative for dysuria and difficulty urinating.  Musculoskeletal: Positive for back pain, arthralgias, neck pain and neck stiffness. Negative for gait problem.  Skin: Negative for color change and rash.  Neurological: Negative for dizziness and headaches.  Hematological: Negative for adenopathy.  Psychiatric/Behavioral: Negative for behavioral problems and agitation.      Allergies  Iodine  Home Medications   Prior to Admission medications   Medication Sig Start Date End Date Taking? Authorizing Provider  cyclobenzaprine (FLEXERIL) 5 MG tablet Take 1 tablet (5 mg total) by mouth every 8 (eight) hours as needed for muscle spasms. 07/12/15   Evon Slack, PA-C  HYDROcodone-acetaminophen (NORCO) 5-325 MG tablet Take 1 tablet by mouth every 6 (six) hours as needed for moderate pain. MAXIMUM TOTAL ACETAMINOPHEN DOSE IS 4000 MG PER DAY 07/12/15   Evon Slack, PA-C  ibuprofen (ADVIL,MOTRIN) 800 MG tablet Take 1 tablet (800 mg total) by mouth every 8 (eight) hours as needed. 02/14/15   Evangeline Dakin, PA-C  meloxicam (MOBIC) 15 MG tablet Take 1 tablet (15 mg total) by mouth daily. 07/12/15   Evon Slack, PA-C  predniSONE (DELTASONE) 10 MG tablet Take 5 tablets (50 mg total) by mouth daily with breakfast. 02/14/15   Charmayne Sheer Beers, PA-C   BP 123/75 mmHg  Pulse 110  Temp(Src) 98.2 F (36.8 C) (Oral)  Resp 18  Ht  (1.626 m)  Wt 61.236 kg  BMI 23.16 kg/m2  SpO2 99% Physical Exam  Constitutional: He is oriented to person, place, and time. He appears well-developed and well-nourished.  HENT:  Head: Normocephalic and atraumatic.  Right Ear: External ear normal.  Left Ear: External ear normal.  Nose: Nose normal.  Mouth/Throat: Oropharynx is clear and  moist.  Eyes: Conjunctivae and EOM are normal. Pupils are equal, round, and reactive to light.  Neck: Normal range of motion. Neck supple.  Cardiovascular: Normal rate, regular rhythm, normal heart sounds and intact distal pulses.   Pulmonary/Chest: Effort normal and breath sounds normal. No respiratory distress. He has no wheezes. He has no rales. He exhibits no tenderness.  Abdominal: Soft. Bowel sounds are normal. He exhibits no distension and no mass. There is no tenderness. There is no rebound and no guarding.  Musculoskeletal:  Examination of cervical spine shows patient has full range of motion. He has minimal tender along the spinous process in left and right paravertebral muscles. No muscle spasms noted.  Examination of the thoracic spine shows patient has minimal tenderness palpation along the spinous processes or paravertebral muscles. He has full range of motion with mild discomfort. No bruising or ecchymosis.  Examination of the lumbar spine shows patient has mild spinous process tenderness with left and right paravertebral muscle tenderness. He has full range of motion of the lumbar spine. No SI joint or sciatic notch tenderness.  Examination of bilateral lower sugars show patient has full range of motion of the hips no discomfort. He has full range of motion of the left and right knee, right knee has painful hyperflexion. There is no warmth erythema or effusion of bilateral lower extremities. Both knees are stable to valgus and varus stress testing.  Bilateral upper extremities show patient has full range of motion with flexion and abduction and internal Rotation. Patient has painful right shoulder abduction and flexion. He is tender along the subacromial space, before meals joint. He also has painful range of motion of the right elbow with no warmth erythema or effusion. He has 5 out of 5 grip strength, biceps, triceps strength of the right upper extremity. Radial pulse. Patient is  tender over the right superior scapular border. No winging of the scapula.   Lymphadenopathy:    He has no cervical adenopathy.  Neurological: He is alert and oriented to person, place, and time.  Skin: Skin is warm and dry.  Psychiatric: He has a normal mood and affect. His behavior is normal. Judgment and thought content normal.    ED Course  Procedures (including critical care time) Labs Review Labs Reviewed - No data to display  Imaging Review Dg Thoracic Spine 2 View  07/12/2015  CLINICAL DATA:  Pt fell off a ladder on Friday, 15 ft, and landed on right side, states that today when he went to pick up his infant his right arm went numb, complains of neck and back pain that radiates down right arm, right knee pain EXAM: THORACIC SPINE 2 VIEWS COMPARISON:  Chest radiograph 03/11/2015 FINDINGS: Mild curvature of the upper thoracic spine with convexity towards the right. This may be positional. No anterior subluxation of thoracic vertebrae. Mild diffuse degenerative changes with narrowed disc space heights and endplate hypertrophic changes throughout. Anterior wedge deformities at T11 and T12 are unchanged since prior chest radiograph. No acute compression is identified. Bone cortex appears intact. No destructive bone lesions. No paraspinal soft tissue swelling. IMPRESSION: Degenerative changes in the thoracic spine. Chronic compression of T11 and T12. No acute fractures identified. Electronically  Signed   By: Burman Nieves M.D.   On: 07/12/2015 18:13   Dg Lumbar Spine Complete  07/12/2015  CLINICAL DATA:  Patient fell 15 feet off of a ladder on Friday landing on the right side. Today his right arm went numb. Neck and back pain radiating down the right arm. Right knee pain. Low back pain. EXAM: LUMBAR SPINE - COMPLETE 4+ VIEW COMPARISON:  None. FINDINGS: Normal alignment of the lumbar spine. There is anterior wedge deformity of T11 and T12 but this appears stable since a previous chest  radiograph from 03/11/2015. No acute compression deformities are suggested. Mild endplate hypertrophic changes at T10-11, T11-12, and L5-S1. Normal alignment of the facet joints. No destructive bone lesions appreciated. SI joints and visualized sacral struts appear intact. IMPRESSION: Old compression deformities of T11 and T12. Normal alignment. No acute bony abnormalities demonstrated. Electronically Signed   By: Burman Nieves M.D.   On: 07/12/2015 18:09   Dg Scapula Right  07/12/2015  CLINICAL DATA:  30 year old male status post 15 foot fall at work Friday afternoon. Right shoulder pain. EXAM: RIGHT SCAPULA - 2+ VIEWS COMPARISON:  Concurrently obtained radiographs of the right shoulder 07/12/2015. Additional supplemental views. FINDINGS: There is no evidence of fracture or other focal bone lesions. Soft tissues are unremarkable. The humeral head is located. IMPRESSION: Negative. Electronically Signed   By: Malachy Moan M.D.   On: 07/12/2015 19:05   Dg Shoulder Right  07/12/2015  CLINICAL DATA:  Initial encounter for Pt fell off a ladder on Friday, 15 ft, and landed on right side, states that today when he went to pick up his infant his right arm went numb, complains of neck and back pain that radiates down right arm, right knee pain EXAM: RIGHT SHOULDER - 2+ VIEW COMPARISON:  None. FINDINGS: Visualized portion of the right hemithorax is normal. No acute fracture or dislocation. Favor artifactual lucency within the scapular body on the first image. IMPRESSION: No acute osseous abnormality. Probable artifactual lucency through the scapular body on 1 image. If pain is localized to this area, recommend dedicated scapular radiographs. Electronically Signed   By: Jeronimo Greaves M.D.   On: 07/12/2015 18:16   Ct Head Wo Contrast  07/12/2015  CLINICAL DATA:  29 year old male with persistent back and neck pain with right arm numbness after falling 15 feet from an extension ladder this past Friday. EXAM: CT  HEAD WITHOUT CONTRAST CT CERVICAL SPINE WITHOUT CONTRAST TECHNIQUE: Multidetector CT imaging of the head and cervical spine was performed following the standard protocol without intravenous contrast. Multiplanar CT image reconstructions of the cervical spine were also generated. COMPARISON:  Prior cervical spine radiographs 06/15/2009 FINDINGS: CT HEAD FINDINGS Negative for acute intracranial hemorrhage, acute infarction, mass, mass effect, hydrocephalus or midline shift. Gray-white differentiation is preserved throughout. No focal scalp contusion or calvarial fracture. Normal aeration of the mastoid air cells and paranasal sinuses. CT CERVICAL SPINE FINDINGS No acute fracture, malalignment or prevertebral soft tissue swelling. Unremarkable CT appearance of the thyroid gland. No acute soft tissue abnormality. The lung apices are unremarkable. IMPRESSION: CT HEAD 1. Negative CT CSPINE 1. No acute fracture or malalignment. Electronically Signed   By: Malachy Moan M.D.   On: 07/12/2015 17:36   Ct Cervical Spine Wo Contrast  07/12/2015  CLINICAL DATA:  29 year old male with persistent back and neck pain with right arm numbness after falling 15 feet from an extension ladder this past Friday. EXAM: CT HEAD WITHOUT CONTRAST CT CERVICAL SPINE WITHOUT  CONTRAST TECHNIQUE: Multidetector CT imaging of the head and cervical spine was performed following the standard protocol without intravenous contrast. Multiplanar CT image reconstructions of the cervical spine were also generated. COMPARISON:  Prior cervical spine radiographs 06/15/2009 FINDINGS: CT HEAD FINDINGS Negative for acute intracranial hemorrhage, acute infarction, mass, mass effect, hydrocephalus or midline shift. Gray-white differentiation is preserved throughout. No focal scalp contusion or calvarial fracture. Normal aeration of the mastoid air cells and paranasal sinuses. CT CERVICAL SPINE FINDINGS No acute fracture, malalignment or prevertebral soft  tissue swelling. Unremarkable CT appearance of the thyroid gland. No acute soft tissue abnormality. The lung apices are unremarkable. IMPRESSION: CT HEAD 1. Negative CT CSPINE 1. No acute fracture or malalignment. Electronically Signed   By: Malachy Moan M.D.   On: 07/12/2015 17:36   Dg Knee Complete 4 Views Right  07/12/2015  CLINICAL DATA:  Pt fell off a ladder on Friday, 15 ft, and landed on right side, states that today when he went to pick up his infant his right arm went numb, complains of neck and back pain that radiates down right arm, right knee pain EXAM: RIGHT KNEE - COMPLETE 4+ VIEW COMPARISON:  None. FINDINGS: There is no evidence of fracture, dislocation, or joint effusion. There is no evidence of arthropathy or other focal bone abnormality. Soft tissues are unremarkable. IMPRESSION: Negative. Electronically Signed   By: Burman Nieves M.D.   On: 07/12/2015 18:14   I have personally reviewed and evaluated these images and lab results as part of my medical decision-making.   EKG Interpretation None      MDM   Final diagnoses:  Pain of right scapula  Bone bruise  Cervicalgia  Midline low back pain without sciatica  Cervical radiculopathy, acute  Elbow contusion, right, initial encounter  Knee contusion, right, initial encounter  Strain of muscle, fascia and tendon of lower back, initial encounter    29 year old male with fall 3 days ago from 15 foot tall ladder. X-rays of the cervical, thoracic, lumbar spine, right shoulder, right scapula, right knee show no evidence of acute bony abnormality. Vital signs are stable. Patient described a episode of cervical radiculopathy that has resolved. He has no signs of upper or lower extremity neurological deficits. Patient will be started on a regimen of anti-inflammatory medications, meloxicam, Norco, Flexeril. He will follow-up with orthopedics in 5-7 days if no improvement. Return to the ER for any worsening symptoms urgent  changes in his health.    Evon Slack, PA-C 07/12/15 1938  Richardean Canal, MD 07/12/15 [phone]

## 2015-09-06 ENCOUNTER — Emergency Department
Admission: EM | Admit: 2015-09-06 | Discharge: 2015-09-06 | Disposition: A | Discharge status: Home / Self Care | Payer: Self-pay | Attending: Emergency Medicine | Admitting: Emergency Medicine

## 2015-09-06 ENCOUNTER — Encounter: Payer: Self-pay | Admitting: Emergency Medicine

## 2015-09-06 DIAGNOSIS — F17210 Nicotine dependence, cigarettes, uncomplicated: Secondary | ICD-10-CM | POA: Insufficient documentation

## 2015-09-06 DIAGNOSIS — Z791 Long term (current) use of non-steroidal anti-inflammatories (NSAID): Secondary | ICD-10-CM | POA: Insufficient documentation

## 2015-09-06 DIAGNOSIS — M5412 Radiculopathy, cervical region: Secondary | ICD-10-CM | POA: Insufficient documentation

## 2015-09-06 HISTORY — DX: Other cervical disc degeneration, unspecified cervical region: M50.30

## 2015-09-06 HISTORY — DX: Other injury of unspecified body region, initial encounter: T14.8XXA

## 2015-09-06 MED ORDER — PREDNISONE 10 MG (21) PO TBPK
ORAL_TABLET | ORAL | Status: DC
Start: 2015-09-06 — End: 2015-11-01

## 2015-09-06 MED ORDER — HYDROCODONE-ACETAMINOPHEN 5-325 MG PO TABS: 1.0000 | ORAL_TABLET | ORAL | Status: DC | PRN

## 2015-09-06 NOTE — ED Provider Notes (Signed)
[facility] Emergency Department Provider Note ____________________________________________  Time seen: Approximately 6:12 PM  I have reviewed the triage vital signs and the nursing notes.   HISTORY  Chief Complaint Neck Pain    HPI Craig Ellis is a 30 y.o. male who presents to the emergency department for evaluation of neck pain. He states he fell off of a ladder back in December and the pain has now caused his right arm and hand to feel numb, tingly, and occasionally experiences weakness. He has been taking flexeril and meloxicam without relief.   Past Medical History  Diagnosis Date  . Degenerative disc disease, cervical   . Fracture     T11 and T 12 fracture august 2016  . Degenerative disc disease, cervical     There are no active problems to display for this patient.   History reviewed. No pertinent past surgical history.  Current Outpatient Rx  Name  Route  Sig  Dispense  Refill  . cyclobenzaprine (FLEXERIL) 5 MG tablet   Oral   Take 1 tablet (5 mg total) by mouth every 8 (eight) hours as needed for muscle spasms.   30 tablet   1   . HYDROcodone-acetaminophen (NORCO/VICODIN) 5-325 MG tablet   Oral   Take 1 tablet by mouth every 4 (four) hours as needed for moderate pain.   12 tablet   0   . ibuprofen (ADVIL,MOTRIN) 800 MG tablet   Oral   Take 1 tablet (800 mg total) by mouth every 8 (eight) hours as needed.   30 tablet   0   . meloxicam (MOBIC) 15 MG tablet   Oral   Take 1 tablet (15 mg total) by mouth daily.   30 tablet   2   . predniSONE (STERAPRED UNI-PAK 21 TAB) 10 MG (21) TBPK tablet      Take 6 tablets on day 1 Take 5 tablets on day 2 Take 4 tablets on day 3 Take 3 tablets on day 4 Take 2 tablets on day 5 Take 1 tablet on day 6   21 tablet   0     Allergies Iodine  No family history on file.  Social History Social History  Substance Use Topics  . Smoking status: Current Every Day Smoker -- 1.00  packs/day    Types: Cigarettes  . Smokeless tobacco: None  . Alcohol Use: No    Review of Systems Constitutional: No recent illness. Eyes: No visual changes. ENT: No sore throat. Cardiovascular: Denies chest pain or palpitations. Respiratory: Denies shortness of breath. Gastrointestinal: No abdominal pain.  Genitourinary: Negative for dysuria. Musculoskeletal: Pain in neck with radiation to the right arm and hand Skin: Negative for rash. Neurological: Negative for headaches, focal weakness or numbness. 10-point ROS otherwise unremarkable.  ____________________________________________   PHYSICAL EXAM:  VITAL SIGNS: ED Triage Vitals  Enc Vitals Group     BP 09/06/15 1625 130/90 mmHg     Pulse Rate 09/06/15 1625 74     Resp 09/06/15 1625 18     Temp 09/06/15 1625 97.8 F (36.6 C)     Temp Source 09/06/15 1625 Oral     SpO2 09/06/15 1625 100 %     Weight 09/06/15 1625 135 lb (61.236 kg)     Height 09/06/15 1625  (1.651 m)     Head Cir --      Peak Flow --      Pain Score 09/06/15 1627 9     Pain  Loc --      Pain Edu? --      Excl. in GC? --     Constitutional: Alert and oriented. Well appearing and in no acute distress. Eyes: Conjunctivae are normal. EOMI. Head: Atraumatic. Neck: No stridor. No midline tenderness upon palpation of cervical spine. Atraumatic Respiratory: Normal respiratory effort.   Musculoskeletal: Full range of motion of the neck. Neurologic:  Normal speech and language. No gross focal neurologic deficits are appreciated. Speech is normal. No gait instability. Grip strength is equal as well as movement against resistance Skin:  Skin is warm, dry and intact. Atraumatic. Psychiatric: Mood and affect are normal. Speech and behavior are normal.  ____________________________________________   LABS (all labs ordered are listed, but only abnormal results are displayed)  Labs Reviewed - No data to  display ____________________________________________  RADIOLOGY  Not indicated today ____________________________________________   PROCEDURES  Procedure(s) performed: None   ____________________________________________   INITIAL IMPRESSION / ASSESSMENT AND PLAN / ED COURSE  Pertinent labs & imaging results that were available during my care of the patient were reviewed by me and considered in my medical decision making (see chart for details).  Patient states that he has an appointment this week with a spine specialist, but he came to the emergency department today because he feels that the weakness in his hand is becoming more frequent. He states that he has some relief with his Flexeril but he is unable to take that if he is going to work. He states that he has taken his meloxicam every day as prescribed.  No weakness of the right arm or inequality of grip strength is noted today on exam. There is no temperature discrepancy between the right and left upper extremities. Patient has 2 point touch discrimination in all the fingers and hand on the right.  Patient will be treated with a prednisone tapered dose pack and Norco. He was encouraged to continue taking his meloxicam as prescribed. He was encouraged to keep his appointment with the spine specialist. He was advised to return to the emergency department for symptoms that change or worsen if he is unable schedule an earlier appointment. ____________________________________________   FINAL CLINICAL IMPRESSION(S) / ED DIAGNOSES  Final diagnoses:  Cervical radiculopathy, acute       Chinita Pester, FNP 09/06/15 1927  Jene Every, MD 09/06/15 [phone]

## 2015-09-06 NOTE — ED Notes (Signed)
States pain in neck and shoulder and down arm since fall in December. Does have appointment with [facility] spine center this Thursday.

## 2015-09-06 NOTE — ED Notes (Signed)
Patient is c/o neck pain that radiates down into his right shoulder and arm. Patient states that he fell off a ladder in December and has an appointment to be seen at the [facility] spine center on Thursday but he is having a lot of pain. Patient states that he is losing strength in his arm, unable to lift his son. Patient is having numbness in his right hand, feel like there are "pins and needles" sticking in his arm.

## 2015-09-06 NOTE — Discharge Instructions (Signed)

## 2015-09-13 ENCOUNTER — Emergency Department
Admission: EM | Admit: 2015-09-13 | Discharge: 2015-09-13 | Disposition: A | Discharge status: Home / Self Care | Payer: Self-pay | Attending: Emergency Medicine | Admitting: Emergency Medicine

## 2015-09-13 ENCOUNTER — Encounter: Payer: Self-pay | Admitting: *Deleted

## 2015-09-13 DIAGNOSIS — M542 Cervicalgia: Secondary | ICD-10-CM | POA: Insufficient documentation

## 2015-09-13 DIAGNOSIS — G8929 Other chronic pain: Secondary | ICD-10-CM | POA: Insufficient documentation

## 2015-09-13 DIAGNOSIS — F17210 Nicotine dependence, cigarettes, uncomplicated: Secondary | ICD-10-CM | POA: Insufficient documentation

## 2015-09-13 DIAGNOSIS — M5410 Radiculopathy, site unspecified: Secondary | ICD-10-CM | POA: Insufficient documentation

## 2015-09-13 DIAGNOSIS — M792 Neuralgia and neuritis, unspecified: Secondary | ICD-10-CM

## 2015-09-13 DIAGNOSIS — Z791 Long term (current) use of non-steroidal anti-inflammatories (NSAID): Secondary | ICD-10-CM | POA: Insufficient documentation

## 2015-09-13 NOTE — Discharge Instructions (Signed)
Chronic Pain  Chronic pain can be defined as pain that is off and on and lasts for 3-6 months or longer. Many things cause chronic pain, which can make it difficult to make a diagnosis. There are many treatment options available for chronic pain. However, finding a treatment that works well for you may require trying various approaches until the right one is found. Many people benefit from a combination of two or more types of treatment to control their pain.  SYMPTOMS   Chronic pain can occur anywhere in the body and can range from mild to very severe. Some types of chronic pain include:  · Headache.  · Low back pain.  · Cancer pain.  · Arthritis pain.  · Neurogenic pain. This is pain resulting from damage to nerves.   People with chronic pain may also have other symptoms such as:  · Depression.  · Anger.  · Insomnia.  · Anxiety.  DIAGNOSIS   Your health care provider will help diagnose your condition over time. In many cases, the initial focus will be on excluding possible conditions that could be causing the pain. Depending on your symptoms, your health care provider may order tests to diagnose your condition. Some of these tests may include:   · Blood tests.    · CT scan.    · MRI.    · X-rays.    · Ultrasounds.    · Nerve conduction studies.    You may need to see a specialist.   TREATMENT   Finding treatment that works well may take time. You may be referred to a pain specialist. He or she may prescribe medicine or therapies, such as:   · Mindful meditation or yoga.  · Shots (injections) of numbing or pain-relieving medicines into the spine or area of pain.  · Local electrical stimulation.  · Acupuncture.    · Massage therapy.    · Aroma, color, light, or sound therapy.    · Biofeedback.    · Working with a physical therapist to keep from getting stiff.    · Regular, gentle exercise.    · Cognitive or behavioral therapy.    · Group support.    Sometimes, surgery may be recommended.   HOME CARE INSTRUCTIONS    · Take all medicines as directed by your health care provider.    · Lessen stress in your life by relaxing and doing things such as listening to calming music.    · Exercise or be active as directed by your health care provider.    · Eat a healthy diet and include things such as vegetables, fruits, fish, and lean meats in your diet.    · Keep all follow-up appointments with your health care provider.    · Attend a support group with others suffering from chronic pain.  SEEK MEDICAL CARE IF:   · Your pain gets worse.    · You develop a new pain that was not there before.    · You cannot tolerate medicines given to you by your health care provider.    · You have new symptoms since your last visit with your health care provider.    SEEK IMMEDIATE MEDICAL CARE IF:   · You feel weak.    · You have decreased sensation or numbness.    · You lose control of bowel or bladder function.    · Your pain suddenly gets much worse.    · You develop shaking.  · You develop chills.  · You develop confusion.  · You develop chest pain.  · You develop shortness of breath.    MAKE SURE YOU:  ·   Document Revised: 03/19/2013 Document Reviewed: 01/10/2013 Elsevier Interactive Patient Education [employer]    You will need to contact the spine center for continued medication for your neck pain. Continue taking diclofenac as directed

## 2015-09-13 NOTE — ED Notes (Signed)
Pt injured neck and right shoulder at work in December, pt awaiting a MRI, pt is here today for pain control, pt does not have a primary physician

## 2015-09-13 NOTE — ED Notes (Signed)
States he has an appt with spine center in [place]  But is out of pain meds  Schedules for MRI in 3 weeks ..states pain is returning

## 2015-09-13 NOTE — ED Provider Notes (Signed)
[facility] Emergency Department Provider Note  ____________________________________________  Time seen: Approximately 12:03 PM  I have reviewed the triage vital signs and the nursing notes.   HISTORY  Chief Complaint Neck Pain   HPI Craig Ellis is a 30 y.o. male is here requesting pain medication for neck pain. Patient has a history of a injury when he fell off a ladder back in December and states that he was seen at the spine Center in [place] last week. They discontinued his meloxicam and started him on diclofenac which he states is not helping as much is on meloxicam and prednisone. He reports that he is scheduled for a MRI in 3 weeks but is "in a lot of pain". He states the spine center told him that they would put him on the diclofenac but he is to come to the emergency room for any narcotics.Currently he rates his pain as an 8 out of 10. He states that he has radiation down his right arm as previously described when he was seen in the emergency room on 09/06/15.   Past Medical History  Diagnosis Date  . Degenerative disc disease, cervical   . Fracture     T11 and T 12 fracture august 2016  . Degenerative disc disease, cervical     There are no active problems to display for this patient.   History reviewed. No pertinent past surgical history.  Current Outpatient Rx  Name  Route  Sig  Dispense  Refill  . cyclobenzaprine (FLEXERIL) 5 MG tablet   Oral   Take 1 tablet (5 mg total) by mouth every 8 (eight) hours as needed for muscle spasms.   30 tablet   1   . HYDROcodone-acetaminophen (NORCO/VICODIN) 5-325 MG tablet   Oral   Take 1 tablet by mouth every 4 (four) hours as needed for moderate pain.   12 tablet   0   . ibuprofen (ADVIL,MOTRIN) 800 MG tablet   Oral   Take 1 tablet (800 mg total) by mouth every 8 (eight) hours as needed.   30 tablet   0   . meloxicam (MOBIC) 15 MG tablet   Oral   Take 1 tablet (15 mg total) by mouth  daily.   30 tablet   2   . predniSONE (STERAPRED UNI-PAK 21 TAB) 10 MG (21) TBPK tablet      Take 6 tablets on day 1 Take 5 tablets on day 2 Take 4 tablets on day 3 Take 3 tablets on day 4 Take 2 tablets on day 5 Take 1 tablet on day 6   21 tablet   0     Allergies Iodine  No family history on file.  Social History Social History  Substance Use Topics  . Smoking status: Current Every Day Smoker -- 1.00 packs/day    Types: Cigarettes  . Smokeless tobacco: None  . Alcohol Use: No    Review of Systems Constitutional: No fever/chills Eyes: No visual changes. Cardiovascular: Denies chest pain. Respiratory: Denies shortness of breath. Gastrointestinal:   No nausea, no vomiting.  Musculoskeletal: Chronic neck pain Skin: Negative for rash. Neurological: Negative for headaches. Positive for radiculopathy right arm  10-point ROS otherwise negative.  ____________________________________________   PHYSICAL EXAM:  VITAL SIGNS: ED Triage Vitals  Enc Vitals Group     BP 09/13/15 1116 160/97 mmHg     Pulse Rate 09/13/15 1116 91     Resp 09/13/15 1116 20     Temp  09/13/15 1116 97.9 F (36.6 C)     Temp src --      SpO2 09/13/15 1116 100 %     Weight 09/13/15 1116 135 lb (61.236 kg)     Height 09/13/15 1116  (1.651 m)     Head Cir --      Peak Flow --      Pain Score 09/13/15 1117 8     Pain Loc --      Pain Edu? --      Excl. in GC? --     Constitutional: Alert and oriented. Well appearing and in no acute distress. Eyes: Conjunctivae are normal. PERRL. EOMI. Head: Atraumatic. Nose: No congestion/rhinnorhea. Neck: No stridor.  There is minimal tenderness on palpation of posterior cervical spine. There is tenderness on palpation of the right paraspinous muscles, trapezius. Range of motion is slightly restricted secondary to patient's pain. Hematological/Lymphatic/Immunilogical: No cervical lymphadenopathy. Cardiovascular: Normal rate, regular rhythm.  Grossly normal heart sounds.  Good peripheral circulation. Respiratory: Normal respiratory effort.  No retractions. Lungs CTAB. Gastrointestinal: Soft and nontender. No distention.  Musculoskeletal: Right shoulder no gross deformity however there is pain with range of motion of the right arm in all planes. Muscles are soft, tender trapezius muscles. There is no decreased muscle tone or strength in the right arm and grip strength in the right hand is equal to the left at this time. No gross deformity. There is some mild crepitus on range of motion of the right shoulder. Normal gait was noted. Neurologic:  Normal speech and language. No gross focal neurologic deficits are appreciated. No gait instability. Skin:  Skin is warm, dry and intact. No rash noted. Psychiatric: Mood and affect are normal. Speech and behavior are normal.  ____________________________________________   LABS (all labs ordered are listed, but only abnormal results are displayed)  Labs Reviewed - No data to display   PROCEDURES  Procedure(s) performed: None  Critical Care performed: No  ____________________________________________   INITIAL IMPRESSION / ASSESSMENT AND PLAN / ED COURSE  Pertinent labs & imaging results that were available during my care of the patient were reviewed by me and considered in my medical decision making (see chart for details).  In talking with the patient he is adamant that the spine center sent him to the emergency room for pain medication and that they are only going to give him diclofenac until after his MRI is done. He became extremely disgruntled to know that I are chronic pain policy does not include narcotics. He declined an injection of steroids. When told that we would call the spine center to confirm his story he immediately came out of the room and wanted to be discharged. Patient states he will go somewhere else. Patient was given discharge instructions on chronic pain and also  a copy of our chronic pain and narcotic policy. ____________________________________________   FINAL CLINICAL IMPRESSION(S) / ED DIAGNOSES  Final diagnoses:  Chronic neck pain  Radicular pain in right arm      Tommi Rumps, PA-C 09/13/15 1344  Jene Every, MD 09/13/15 [phone]

## 2015-09-16 ENCOUNTER — Encounter (HOSPITAL_COMMUNITY): Payer: Self-pay | Admitting: Emergency Medicine

## 2015-09-16 ENCOUNTER — Emergency Department (HOSPITAL_COMMUNITY)
Admission: EM | Admit: 2015-09-16 | Discharge: 2015-09-16 | Disposition: A | Discharge status: Home / Self Care | Payer: Self-pay | Attending: Emergency Medicine | Admitting: Emergency Medicine

## 2015-09-16 DIAGNOSIS — R202 Paresthesia of skin: Secondary | ICD-10-CM | POA: Insufficient documentation

## 2015-09-16 DIAGNOSIS — Z8781 Personal history of (healed) traumatic fracture: Secondary | ICD-10-CM | POA: Insufficient documentation

## 2015-09-16 DIAGNOSIS — F17210 Nicotine dependence, cigarettes, uncomplicated: Secondary | ICD-10-CM | POA: Insufficient documentation

## 2015-09-16 DIAGNOSIS — G8929 Other chronic pain: Secondary | ICD-10-CM | POA: Insufficient documentation

## 2015-09-16 MED ORDER — KETOROLAC TROMETHAMINE 60 MG/2ML IM SOLN
30.0000 mg | Freq: Once | INTRAMUSCULAR | Status: AC
  Administered 2015-09-16: 30 mg via INTRAMUSCULAR
  Filled 2015-09-16: qty 2

## 2015-09-16 NOTE — Discharge Instructions (Signed)
Take the Medrol Dose Pack [facility] after discussion of your MRI results and go for the injection that you are scheduled for. You will need to call you doctor at [facility] if you feel you need additional medications since he is currently managing your pain. It is important to have continued care by one provider to assure you are getting the best possible care and the right medications.   Chronic Pain Chronic pain can be defined as pain that is off and on and lasts for 3-6 months or longer. Many things cause chronic pain, which can make it difficult to make a diagnosis. There are many treatment options available for chronic pain. However, finding a treatment that works well for you may require trying various approaches until the right one is found. Many people benefit from a combination of two or more types of treatment to control their pain. SYMPTOMS  Chronic pain can occur anywhere in the body and can range from mild to very severe. Some types of chronic pain include:  Headache.  Low back pain.  Cancer pain.  Arthritis pain.  Neurogenic pain. This is pain resulting from damage to nerves. People with chronic pain may also have other symptoms such as:  Depression.  Anger.  Insomnia.  Anxiety. DIAGNOSIS  Your health care provider will help diagnose your condition over time. In many cases, the initial focus will be on excluding possible conditions that could be causing the pain. Depending on your symptoms, your health care provider may order tests to diagnose your condition. Some of these tests may include:   Blood tests.   CT scan.   MRI.   X-rays.   Ultrasounds.   Nerve conduction studies.  You may need to see a specialist.  TREATMENT  Finding treatment that works well may take time. You may be referred to a pain specialist. He or she may prescribe medicine or therapies, such as:   Mindful meditation or  yoga.  Shots (injections) of numbing or pain-relieving medicines into the spine or area of pain.  Local electrical stimulation.  Acupuncture.   Massage therapy.   Aroma, color, light, or sound therapy.   Biofeedback.   Working with a physical therapist to keep from getting stiff.   Regular, gentle exercise.   Cognitive or behavioral therapy.   Group support.  Sometimes, surgery may be recommended.  HOME CARE INSTRUCTIONS   Take all medicines as directed by your health care provider.   Lessen stress in your life by relaxing and doing things such as listening to calming music.   Exercise or be active as directed by your health care provider.   Eat a healthy diet and include things such as vegetables, fruits, fish, and lean meats in your diet.   Keep all follow-up appointments with your health care provider.   Attend a support group with others suffering from chronic pain. SEEK MEDICAL CARE IF:   Your pain gets worse.   You develop a new pain that was not there before.   You cannot tolerate medicines given to you by your health care provider.   You have new symptoms since your last visit with your health care provider.  SEEK IMMEDIATE MEDICAL CARE IF:   You feel weak.   You have decreased sensation or numbness.   You lose control of bowel or bladder function.   Your pain suddenly gets much worse.   You develop shaking.  You develop chills.  You develop confusion.  You develop chest pain.  You develop shortness of breath.  MAKE SURE YOU:  Understand these instructions.  Will watch your condition.  Will get help right away if you are not doing well or get worse.   This information is not intended to replace advice given to you by your health care provider. Make sure you discuss any questions you have with your health care provider.   Document Released: 04/08/2002 Document Revised: 03/19/2013 Document Reviewed:  01/10/2013 Elsevier Interactive Patient Education [employer]

## 2015-09-16 NOTE — ED Notes (Signed)
Pt sts plywood fell on pt shoulder 5 days ago; pt having right shoulder, upper back and neck pain

## 2015-09-16 NOTE — ED Notes (Signed)
Patient is alert and orientedx4.  Patient was explained discharge instructions and they understood them with no questions.   

## 2015-09-16 NOTE — ED Provider Notes (Signed)
CSN: [id number]     Arrival date & time 09/16/15  1643 History  By signing my name below, I, Craig Ellis, attest that this documentation has been prepared under the direction and in the presence of Craig Buffalo, NP Electronically Signed: Soijett Ellis, ED Scribe. 09/16/2015. 5:53 PM.   Chief Complaint  Patient presents with  . Neck Pain  . Shoulder Pain      Patient is a 30 y.o. male presenting with shoulder pain. The history is provided by the patient. No language interpreter was used.  Shoulder Pain Location:  Shoulder Time since incident:  5 days Injury: yes   Mechanism of injury comment:  Plywood falling on his right shoulder Shoulder location:  R shoulder Pain details:    Quality:  Unable to specify   Radiates to:  Does not radiate   Severity:  Mild   Onset quality:  Sudden   Duration:  5 days   Timing:  Constant   Progression:  Unchanged Chronicity:  Recurrent Prior injury to area:  No Relieved by:  Nothing Worsened by:  Movement Ineffective treatments:  Ice and being still Associated symptoms: neck pain and tingling (to right fingers)   Associated symptoms: no decreased range of motion and no fever     Craig Ellis is a 30 y.o. male with a medical hx of DDD and T11/T12 fracture who presents to the Emergency Department complaining of constant right shoulder pain onset 5 days ago. He reports that plywood fell on the pt right shoulder and right sided neck while at work. He states that his right shoulder pain radiates to his right fingers with associated numbness to his right fingertips. He notes that he hasn't been able to sleep for the past couple of days due to the pain. He notes that he originally injured his back in 03/2015 and he had xrays completed at [facility] and Rx meloxicam and norco 5-325 mg. Pt had a MRI completed on his neck at [facility] spine [facility] center on 09/15/2015 that he was referred to by [facility] clinic. Pt denies seeing a doctor at [facility] spine [facility] imaging  center at the time of his MRI. Pt notes that this is a workers comp issue and that his workers comp people have not referred him to follow up with anyone. Pt is having associated symptoms of upper back pain, neck pain, numbness to right fingers, and dizziness due to pain. He notes that he has tried tylenol, cool/warm compresses, rest with no relief of his symptoms. He denies color change, wound, rash, hitting his head, LOC, and any other symptoms.    Per pt chart review: Pt was seen in the at [facility] ED on 09/13/2015 for neck pain and right shoulder pain. Pt didn't have any labs or imaging completed at the time. Pt informed the provider at that visit, that the spine center informed him that he could come to the ED for pain medication. Pt was offered an injection of steroids to which the pt declined at the time. Pt was seen at [facility] spine center on 09/09/15 by Dr. Windell Norfolk for neck pain and dx with cervical radiculopathy. Pt was Rx 75 mg diclofenac BID. Pt had a MRI C-spine completed on 09/15/2015 which resulted as the following: Motion limited examination. Moderate degenerative disc disease in the cervical spine most pronounced at C5-6. This results in moderate spinal canal and mild bilateral foraminal narrowing. Pt also called Dr. Glenice Laine on 09/16/15 prior to his arrival to the  ED and informed the provider that he has improving neck pain but there was still pain. Dr. Glenice Laine Rx a medrol dose pack for the pt and ordered a c-spine epidural steroid injection for his next visit.   Past Medical History  Diagnosis Date  . Degenerative disc disease, cervical   . Fracture     T11 and T 12 fracture august 2016  . Degenerative disc disease, cervical    History reviewed. No pertinent past surgical history. History reviewed. No pertinent family history. Social History  Substance Use Topics  . Smoking status: Current Every Day Smoker -- 1.00 packs/day    Types: Cigarettes  . Smokeless tobacco: None  . Alcohol  Use: No    Review of Systems  Constitutional: Negative for fever.  Cardiovascular: Negative for syncope.  Musculoskeletal: Positive for arthralgias and neck pain.  Neurological: Negative for headaches.       Tingling to right fingers  All other systems reviewed and are negative.     Allergies  Iodine  Home Medications   Prior to Admission medications   Medication Sig Start Date End Date Taking? Authorizing Provider  acetaminophen (TYLENOL) 500 MG tablet Take 500 mg by mouth every 6 (six) hours as needed for mild pain.   Yes Historical Provider, MD  cyclobenzaprine (FLEXERIL) 5 MG tablet Take 1 tablet (5 mg total) by mouth every 8 (eight) hours as needed for muscle spasms. Patient not taking: Reported on 09/16/2015 07/12/15   Evon Slack, PA-C  HYDROcodone-acetaminophen (NORCO/VICODIN) 5-325 MG tablet Take 1 tablet by mouth every 4 (four) hours as needed for moderate pain. Patient not taking: Reported on 09/16/2015 09/06/15   Chinita Pester, FNP  ibuprofen (ADVIL,MOTRIN) 800 MG tablet Take 1 tablet (800 mg total) by mouth every 8 (eight) hours as needed. Patient not taking: Reported on 09/16/2015 02/14/15   Charmayne Sheer Beers, PA-C  meloxicam (MOBIC) 15 MG tablet Take 1 tablet (15 mg total) by mouth daily. Patient not taking: Reported on 09/16/2015 07/12/15   Evon Slack, PA-C  predniSONE (STERAPRED UNI-PAK 21 TAB) 10 MG (21) TBPK tablet Take 6 tablets on day 1 Take 5 tablets on day 2 Take 4 tablets on day 3 Take 3 tablets on day 4 Take 2 tablets on day 5 Take 1 tablet on day 6 Patient not taking: Reported on 09/16/2015 09/06/15   Cari B Triplett, FNP   BP 127/77 mmHg  Pulse 97  Temp(Src) 97.9 F (36.6 C) (Oral)  Resp 16  SpO2 99% Physical Exam  Constitutional: He is oriented to person, place, and time. He appears well-developed and well-nourished. No distress.  HENT:  Head: Normocephalic and atraumatic.  Right Ear: Tympanic membrane, external ear and ear canal normal.   Left Ear: Tympanic membrane, external ear and ear canal normal.  Mouth/Throat: Uvula is midline, oropharynx is clear and moist and mucous membranes are normal. No posterior oropharyngeal edema or posterior oropharyngeal erythema.  Eyes: Conjunctivae and EOM are normal. Pupils are equal, round, and reactive to light.  Neck: Neck supple.  Cardiovascular: Normal rate, regular rhythm and normal heart sounds.  Exam reveals no gallop and no friction rub.   No murmur heard. Radial pulse 2 + with adequate circulation. Pedal pulse 2+  Pulmonary/Chest: Effort normal and breath sounds normal. No respiratory distress. He has no wheezes. He has no rales.  Abdominal: Soft. There is no tenderness.  Musculoskeletal: Normal range of motion.       Right shoulder: Normal. He exhibits  normal range of motion.  Right shoulder pain with ROM. Right wrist and right elbow without pain. Equal grips bilaterally.    Neurological: He is alert and oriented to person, place, and time. He has normal strength. Gait normal.  Reflex Scores:      Bicep reflexes are 2+ on the right side and 2+ on the left side.      Brachioradialis reflexes are 2+ on the right side and 2+ on the left side.      Patellar reflexes are 2+ on the right side and 2+ on the left side. Skin: Skin is warm and dry.  Psychiatric: He has a normal mood and affect. His behavior is normal.  Nursing note and vitals reviewed.   ED Course  Procedures (including critical care time) DIAGNOSTIC STUDIES: Oxygen Saturation is 99% on RA, nl by my interpretation.    COORDINATION OF CARE: 5:33 PM Discussed treatment plan with pt at bedside which includes toradol injection and follow up with provider at [facility] spine center and pt agreed to plan.     MDM  31 y.o. male with chronic pain who was evaluated 3 days ago at [facility] ED and is being followed by [facility] for his his neck and back pain and had medications phoned in today by his doctor there. Stable for d/c without  any red flags for immediate neuro consult. Encouraged patient to take the medication as directed that was called in for him today by his doctor for pain and inflammation and the importance of having one provider to assure he does not get duplicate medications or medications that may have a bad interaction with medicine he gets from other providers. Patient upset that he is not getting narcotic pain medication.  Final diagnoses:  Chronic pain   I personally performed the services described in this documentation, which was scribed in my presence. The recorded information has been reviewed and is accurate.    [address] [place], [place] 09/17/15 1610  Richardean Canal, MD 09/17/15 1455

## 2015-11-01 ENCOUNTER — Emergency Department: Payer: Self-pay

## 2015-11-01 ENCOUNTER — Emergency Department
Admission: EM | Admit: 2015-11-01 | Discharge: 2015-11-01 | Disposition: A | Discharge status: Home / Self Care | Payer: Self-pay | Attending: Emergency Medicine | Admitting: Emergency Medicine

## 2015-11-01 ENCOUNTER — Encounter: Payer: Self-pay | Admitting: Emergency Medicine

## 2015-11-01 DIAGNOSIS — F17210 Nicotine dependence, cigarettes, uncomplicated: Secondary | ICD-10-CM | POA: Insufficient documentation

## 2015-11-01 DIAGNOSIS — Y999 Unspecified external cause status: Secondary | ICD-10-CM | POA: Insufficient documentation

## 2015-11-01 DIAGNOSIS — W208XXA Other cause of strike by thrown, projected or falling object, initial encounter: Secondary | ICD-10-CM | POA: Insufficient documentation

## 2015-11-01 DIAGNOSIS — Y929 Unspecified place or not applicable: Secondary | ICD-10-CM | POA: Insufficient documentation

## 2015-11-01 DIAGNOSIS — S40011A Contusion of right shoulder, initial encounter: Secondary | ICD-10-CM | POA: Insufficient documentation

## 2015-11-01 DIAGNOSIS — Y9389 Activity, other specified: Secondary | ICD-10-CM | POA: Insufficient documentation

## 2015-11-01 MED ORDER — NAPROXEN 500 MG PO TABS: 500.0000 mg | ORAL_TABLET | Freq: Two times a day (BID) | ORAL | Status: AC

## 2015-11-01 NOTE — ED Notes (Signed)
States right shoulder pain after a box fell on shoulder.

## 2015-11-01 NOTE — Discharge Instructions (Signed)
Contusion A contusion is a deep bruise. Contusions happen when an injury causes bleeding under the skin. Symptoms of bruising include pain, swelling, and discolored skin. The skin may turn blue, purple, or yellow. HOME CARE   Rest the injured area.  If told, put ice on the injured area.  Put ice in a plastic bag.  Place a towel between your skin and the bag.  Leave the ice on for 20 minutes, 2-3 times per day.  If told, put light pressure (compression) on the injured area using an elastic bandage. Make sure the bandage is not too tight. Remove it and put it back on as told by your doctor.  If possible, raise (elevate) the injured area above the level of your heart while you are sitting or lying down.  Take over-the-counter and prescription medicines only as told by your doctor. GET HELP IF:  Your symptoms do not get better after several days of treatment.  Your symptoms get worse.  You have trouble moving the injured area. GET HELP RIGHT AWAY IF:   You have very bad pain.  You have a loss of feeling (numbness) in a hand or foot.  Your hand or foot turns pale or cold.   This information is not intended to replace advice given to you by your health care provider. Make sure you discuss any questions you have with your health care provider.   Document Released: 01/03/2008 Document Revised: 04/07/2015 Document Reviewed: 12/02/2014 Elsevier Interactive Patient Education 2016 [employer] Inc[employer]  Cryotherapy Cryotherapy is when you put ice on your injury. Ice helps lessen pain and puffiness (swelling) after an injury. Ice works the best when you start using it in the first 24 to 48 hours after an injury. HOME CARE  Put a dry or damp towel between the ice pack and your skin.  You may press gently on the ice pack.  Leave the ice on for no more than 10 to 20 minutes at a time.  Check your skin after 5 minutes to make sure your skin is okay.  Rest at least 20 minutes between ice  pack uses.  Stop using ice when your skin loses feeling (numbness).  Do not use ice on someone who cannot tell you when it hurts. This includes small children and people with memory problems (dementia). GET HELP RIGHT AWAY IF:  You have white spots on your skin.  Your skin turns blue or pale.  Your skin feels waxy or hard.  Your puffiness gets worse. MAKE SURE YOU:   Understand these instructions.  Will watch your condition.  Will get help right away if you are not doing well or get worse.   This information is not intended to replace advice given to you by your health care provider. Make sure you discuss any questions you have with your health care provider.   Document Released: 01/03/2008 Document Revised: 10/09/2011 Document Reviewed: 03/09/2011 Elsevier Interactive Patient Education 2016 [employer]    Take naproxen twice a day and use ice to your shoulder. [facility] if any continued problems.

## 2015-11-01 NOTE — ED Provider Notes (Signed)
[facility] Emergency Department Provider Note  ____________________________________________  Time seen: Approximately 11:36 AM  I have reviewed the triage vital signs and the nursing notes.   HISTORY  Chief Complaint Shoulder Injury   HPI Craig Ellis is a 30 y.o. male is here complaining of right shoulder pain after a box fell onto his shoulder. Patient states he does not want to file Workmen's Comp. for this. He is unaware of what was in the box but states he has had pain since that time. He denies any head injury or loss of consciousness. Accident happened today.Patient rates his pain is 7/10 at this time.   Past Medical History  Diagnosis Date  . Degenerative disc disease, cervical   . Fracture     T11 and T 12 fracture august 2016  . Degenerative disc disease, cervical     There are no active problems to display for this patient.   History reviewed. No pertinent past surgical history.  Current Outpatient Rx  Name  Route  Sig  Dispense  Refill  . acetaminophen (TYLENOL) 500 MG tablet   Oral   Take 500 mg by mouth every 6 (six) hours as needed for mild pain.         . naproxen (NAPROSYN) 500 MG tablet   Oral   Take 1 tablet (500 mg total) by mouth 2 (two) times daily with a meal.   30 tablet   0     Allergies Iodine  No family history on file.  Social History Social History  Substance Use Topics  . Smoking status: Current Every Day Smoker -- 1.00 packs/day    Types: Cigarettes  . Smokeless tobacco: None  . Alcohol Use: No    Review of Systems Constitutional: No fever/chills ENT: No trauma Cardiovascular: Denies chest pain. Respiratory: Denies shortness of breath. Gastrointestinal:   No nausea, no vomiting.   Musculoskeletal: Right shoulder pain positive. Skin: Negative for rash. Neurological: Negative for headaches, focal weakness or numbness.  10-point ROS otherwise  negative.  ____________________________________________   PHYSICAL EXAM:  VITAL SIGNS: ED Triage Vitals  Enc Vitals Group     BP 11/01/15 1028 112/69 mmHg     Pulse Rate 11/01/15 1028 70     Resp 11/01/15 1028 16     Temp 11/01/15 1028 98.1 F (36.7 C)     Temp Source 11/01/15 1028 Oral     SpO2 11/01/15 1028 98 %     Weight 11/01/15 1028 135 lb (61.236 kg)     Height 11/01/15 1028  (1.626 m)     Head Cir --      Peak Flow --      Pain Score 11/01/15 1029 7     Pain Loc --      Pain Edu? --      Excl. in GC? --     Constitutional: Alert and oriented. Well appearing and in no acute distress. Eyes: Conjunctivae are normal. PERRL. EOMI. Head: Atraumatic. Nose: No congestion/rhinnorhea. Neck: No stridor.  Range of motion isn't restricted. Cardiovascular: Normal rate, regular rhythm. Grossly normal heart sounds.  Good peripheral circulation. Respiratory: Normal respiratory effort.  No retractions. Lungs CTAB. Gastrointestinal: Soft and nontender. No distention.  Musculoskeletal: Examination of the right shoulder there is no gross deformity, soft tissue swelling or abrasions noted. Patient was noted while taking off a hoodie and 2 T-shirts he had no restriction of range of motion. No crepitus with range of motion. Patient restricts  movement of his right arm due to pain. Neurologic:  Normal speech and language. No gross focal neurologic deficits are appreciated. No gait instability. Skin:  Skin is warm, dry and intact. No ecchymosis, abrasions, erythema was noted. Psychiatric: Mood and affect are normal. Speech and behavior are normal.  ____________________________________________   LABS (all labs ordered are listed, but only abnormal results are displayed)  Labs Reviewed - No data to display RADIOLOGY  X-ray right shoulder per radiologist is negative. ____________________________________________   PROCEDURES  Procedure(s) performed: None  Critical Care  performed: No  ____________________________________________   INITIAL IMPRESSION / ASSESSMENT AND PLAN / ED COURSE  Pertinent labs & imaging results that were available during my care of the patient were reviewed by me and considered in my medical decision making (see chart for details).  Patient was given a prescription for Naprosyn 500 mg twice a day with food. Patient is use ice to the area as needed for pain or swelling. He is follow-up with his primary care doctor or Dr. Joice LoftsPoggi if any continued problems with his shoulder. ____________________________________________   FINAL CLINICAL IMPRESSION(S) / ED DIAGNOSES  Final diagnoses:  Contusion of right shoulder, initial encounter      Tommi RumpsRhonda L Keanen Dohse, PA-C 11/01/15 1513  Jene Everyobert Kinner, MD 11/01/15 1555

## 2015-11-01 NOTE — ED Notes (Signed)
Pt states he was at work and a box fell on right shoulder. States this will not be workers comp.  NAD

## 2015-12-07 ENCOUNTER — Encounter: Payer: Self-pay | Admitting: Emergency Medicine

## 2015-12-07 ENCOUNTER — Emergency Department
Admission: EM | Admit: 2015-12-07 | Discharge: 2015-12-07 | Disposition: A | Discharge status: Home / Self Care | Payer: Self-pay | Attending: Emergency Medicine | Admitting: Emergency Medicine

## 2015-12-07 DIAGNOSIS — S61412A Laceration without foreign body of left hand, initial encounter: Secondary | ICD-10-CM | POA: Insufficient documentation

## 2015-12-07 DIAGNOSIS — Z79899 Other long term (current) drug therapy: Secondary | ICD-10-CM | POA: Insufficient documentation

## 2015-12-07 DIAGNOSIS — Y9389 Activity, other specified: Secondary | ICD-10-CM | POA: Insufficient documentation

## 2015-12-07 DIAGNOSIS — W260XXA Contact with knife, initial encounter: Secondary | ICD-10-CM | POA: Insufficient documentation

## 2015-12-07 DIAGNOSIS — Y929 Unspecified place or not applicable: Secondary | ICD-10-CM | POA: Insufficient documentation

## 2015-12-07 DIAGNOSIS — F17210 Nicotine dependence, cigarettes, uncomplicated: Secondary | ICD-10-CM | POA: Insufficient documentation

## 2015-12-07 DIAGNOSIS — M5030 Other cervical disc degeneration, unspecified cervical region: Secondary | ICD-10-CM | POA: Insufficient documentation

## 2015-12-07 DIAGNOSIS — Z23 Encounter for immunization: Secondary | ICD-10-CM | POA: Insufficient documentation

## 2015-12-07 DIAGNOSIS — Z791 Long term (current) use of non-steroidal anti-inflammatories (NSAID): Secondary | ICD-10-CM | POA: Insufficient documentation

## 2015-12-07 DIAGNOSIS — Y999 Unspecified external cause status: Secondary | ICD-10-CM | POA: Insufficient documentation

## 2015-12-07 MED ORDER — LIDOCAINE HCL (PF) 1 % IJ SOLN
5.0000 mL | Freq: Once | INTRAMUSCULAR | Status: AC
  Administered 2015-12-07: 5 mL
  Filled 2015-12-07: qty 5

## 2015-12-07 MED ORDER — TETANUS-DIPHTH-ACELL PERTUSSIS 5-2.5-18.5 LF-MCG/0.5 IM SUSP
0.5000 mL | Freq: Once | INTRAMUSCULAR | Status: AC
  Administered 2015-12-07: 0.5 mL via INTRAMUSCULAR
  Filled 2015-12-07: qty 0.5

## 2015-12-07 NOTE — Discharge Instructions (Signed)
Laceration Care, Adult A laceration is a cut that goes through all layers of the skin. The cut also goes into the tissue that is right under the skin. Some cuts heal on their own. Others need to be closed with stitches (sutures), staples, skin adhesive strips, or wound glue. Taking care of your cut lowers your risk of infection and helps your cut to heal better. HOW TO TAKE CARE OF YOUR CUT For stitches or staples:  Keep the wound clean and dry.  If you were given a bandage (dressing), you should change it at least one time per day or as told by your doctor. You should also change it if it gets wet or dirty.  Keep the wound completely dry for the first 24 hours or as told by your doctor. After that time, you may take a shower or a bath. However, make sure that the wound is not soaked in water until after the stitches or staples have been removed.  Clean the wound one time each day or as told by your doctor:  Wash the wound with soap and water.  Rinse the wound with water until all of the soap comes off.  Pat the wound dry with a clean towel. Do not rub the wound.  After you clean the wound, put a thin layer of antibiotic ointment on it as told by your doctor. This ointment:  Helps to prevent infection.  Keeps the bandage from sticking to the wound.  Have your stitches or staples removed as told by your doctor. If your doctor used skin adhesive strips:   Keep the wound clean and dry.  If you were given a bandage, you should change it at least one time per day or as told by your doctor. You should also change it if it gets dirty or wet.  Do not get the skin adhesive strips wet. You can take a shower or a bath, but be careful to keep the wound dry.  If the wound gets wet, pat it dry with a clean towel. Do not rub the wound.  Skin adhesive strips fall off on their own. You can trim the strips as the wound heals. Do not remove any strips that are still stuck to the wound. They will  fall off after a while. If your doctor used wound glue:  Try to keep your wound dry, but you may briefly wet it in the shower or bath. Do not soak the wound in water, such as by swimming.  After you take a shower or a bath, gently pat the wound dry with a clean towel. Do not rub the wound.  Do not do any activities that will make you really sweaty until the skin glue has fallen off on its own.  Do not apply liquid, cream, or ointment medicine to your wound while the skin glue is still on.  If you were given a bandage, you should change it at least one time per day or as told by your doctor. You should also change it if it gets dirty or wet.  If a bandage is placed over the wound, do not let the tape for the bandage touch the skin glue.  Do not pick at the glue. The skin glue usually stays on for 5-10 days. Then, it falls off of the skin. General Instructions  To help prevent scarring, make sure to cover your wound with sunscreen whenever you are outside after stitches are removed, after adhesive strips are removed,  or when wound glue stays in place and the wound is healed. Make sure to wear a sunscreen of at least 30 SPF.  Take over-the-counter and prescription medicines only as told by your doctor.  If you were given antibiotic medicine or ointment, take or apply it as told by your doctor. Do not stop using the antibiotic even if your wound is getting better.  Do not scratch or pick at the wound.  Keep all follow-up visits as told by your doctor. This is important.  Check your wound every day for signs of infection. Watch for:  Redness, swelling, or pain.  Fluid, blood, or pus.  Raise (elevate) the injured area above the level of your heart while you are sitting or lying down, if possible. GET HELP IF:  You got a tetanus shot and you have any of these problems at the injection site:  Swelling.  Very bad pain.  Redness.  Bleeding.  You have a fever.  A wound that was  closed breaks open.  You notice a bad smell coming from your wound or your bandage.  You notice something coming out of the wound, such as wood or glass.  Medicine does not help your pain.  You have more redness, swelling, or pain at the site of your wound.  You have fluid, blood, or pus coming from your wound.  You notice a change in the color of your skin near your wound.  You need to change the bandage often because fluid, blood, or pus is coming from the wound.  You start to have a new rash.  You start to have numbness around the wound. GET HELP RIGHT AWAY IF:  You have very bad swelling around the wound.  Your pain suddenly gets worse and is very bad.  You notice painful lumps near the wound or on skin that is anywhere on your body.  You have a red streak going away from your wound.  The wound is on your hand or foot and you cannot move a finger or toe like you usually can.  The wound is on your hand or foot and you notice that your fingers or toes look pale or bluish.   This information is not intended to replace advice given to you by your health care provider. Make sure you discuss any questions you have with your health care provider.   Document Released: 01/03/2008 Document Revised: 12/01/2014 Document Reviewed: 07/13/2014 Elsevier Interactive Patient Education [employer]  Please get sutures removed in 10 days. Keep wound clean and dry.

## 2015-12-07 NOTE — ED Notes (Signed)
Gauze dressing applied to suture site

## 2015-12-07 NOTE — ED Notes (Signed)
Pt presents with 1 cm shallow laceration to palm of left hand sustained while using wire cutter. Bleeding controlled, no loss of sensation reported. Pt A&Ox4, pain 6/10.

## 2015-12-07 NOTE — ED Notes (Signed)
Discussed discharge instructions and follow-up care with patient. No questions or concerns at this time. Pt stable at discharge.  

## 2015-12-07 NOTE — ED Provider Notes (Signed)
[facility] Emergency Department Provider Note  ____________________________________________  Time seen: Approximately 11:45 AM  I have reviewed the triage vital signs and the nursing notes.   HISTORY  Chief Complaint Laceration  HPI Craig Ellis is a 30 y.o. male, NAD, presents to the emergency department after sustaining a laceration to his left thenar eminence. He was cutting a wire on his 4-wheeler when the knife slipped slicing his palm. Denies other injury at this time. Bleeding controlled at this time. Patient is not up-to-date on his immunizations. He rates his pain as 6/10.  Past Medical History  Diagnosis Date  . Degenerative disc disease, cervical   . Fracture     T11 and T 12 fracture august 2016  . Degenerative disc disease, cervical     There are no active problems to display for this patient.   History reviewed. No pertinent past surgical history.  Current Outpatient Rx  Name  Route  Sig  Dispense  Refill  . acetaminophen (TYLENOL) 500 MG tablet   Oral   Take 500 mg by mouth every 6 (six) hours as needed for mild pain.         . naproxen (NAPROSYN) 500 MG tablet   Oral   Take 1 tablet (500 mg total) by mouth 2 (two) times daily with a meal.   30 tablet   0     Allergies Iodine; Toradol; and Tramadol  History reviewed. No pertinent family history.  Social History Social History  Substance Use Topics  . Smoking status: Current Every Day Smoker -- 1.00 packs/day    Types: Cigarettes  . Smokeless tobacco: None  . Alcohol Use: No    Review of Systems Constitutional: No fever/chills Eyes: No visual changes. ENT: No sore throat. Cardiovascular: Denies chest pain. Respiratory: Denies shortness of breath. Musculoskeletal: Pain left hand secondary to laceration. Skin: Positive for laceration to the left palm. Negative for rash. Neurological: Negative for headaches, focal weakness or  numbness. ____________________________________________   PHYSICAL EXAM:  VITAL SIGNS: ED Triage Vitals  Enc Vitals Group     BP 12/07/15 1136 121/73 mmHg     Pulse Rate 12/07/15 1136 84     Resp 12/07/15 1136 18     Temp 12/07/15 1136 98.2 F (36.8 C)     Temp Source 12/07/15 1136 Oral     SpO2 12/07/15 1136 97 %     Weight 12/07/15 1136 135 lb (61.236 kg)     Height 12/07/15 1136 5\' 4"  (1.626 m)     Head Cir --      Peak Flow --      Pain Score 12/07/15 1136 6     Pain Loc --      Pain Edu? --      Excl. in GC? --    Constitutional: Alert and oriented. Well appearing and in no acute distress. Eyes: Conjunctivae are normal. PERRL. EOMI. Head: Atraumatic. Nose: No congestion/rhinnorhea. Mouth/Throat: Mucous membranes are moist.  Cardiovascular: Good peripheral circulation.Cap refill less than 3 seconds. Respiratory: Normal respiratory effort.  No retractions. Musculoskeletal: Full ROM to the left hand and thumb without pain.  No lower extremity tenderness nor edema.  No joint effusions.Able to flex and extend all digits without any difficulty. Neurologic:  Normal speech and language. No gross focal neurologic deficits are appreciated. No gait instability. Skin:  1 cm laceration to the left thenar eminence. No foreign body. Skin is warm, dry and intact. No rash noted. Psychiatric:  Mood and affect are normal. Speech and behavior are normal.   PROCEDURES  Procedure(s) performed: LACERATION REPAIR Performed by: Tommi Rumps Authorized by: Tommi Rumps Consent: Verbal consent obtained. Risks and benefits: risks, benefits and alternatives were discussed Consent given by: patient Patient identity confirmed: provided demographic data Prepped and Draped in normal sterile fashion Wound explored  Laceration Location: left thenar eminence  Laceration Length: 1 cm  No Foreign Bodies seen or palpated  Anesthesia: local infiltration  Local anesthetic: lidocaine 1%  w/o epinephrine  Anesthetic total: 1 ml  Irrigation method: syringe Amount of cleaning: standard  Skin closure: 4-0  Number of sutures: 2  Technique: simple interrupted  Patient tolerance: Patient tolerated the procedure well with no immediate complications.  Critical Care performed: No  ____________________________________________   INITIAL IMPRESSION / ASSESSMENT AND PLAN / ED COURSE  Pertinent labs & imaging results that were available during my care of the patient were reviewed by me and considered in my medical decision making (see chart for details).  Laceration to left hand. Cleaned and dressed before leaving. Advised to remove the sutures in 10 days and keep the wound clean and dry. ____________________________________________   FINAL CLINICAL IMPRESSION(S) / ED DIAGNOSES  Final diagnoses:  Laceration of left hand, initial encounter      NEW MEDICATIONS STARTED DURING THIS VISIT:  New Prescriptions   No medications on file     Note:  This document was prepared using Dragon voice recognition software and may include unintentional dictation errors.    Tommi Rumps, PA-C 12/07/15 1445

## 2015-12-21 ENCOUNTER — Emergency Department
Admission: EM | Admit: 2015-12-21 | Discharge: 2015-12-21 | Disposition: A | Discharge status: Home / Self Care | Payer: Self-pay | Attending: Emergency Medicine | Admitting: Emergency Medicine

## 2015-12-21 ENCOUNTER — Encounter: Payer: Self-pay | Admitting: Emergency Medicine

## 2015-12-21 ENCOUNTER — Emergency Department: Payer: Self-pay

## 2015-12-21 DIAGNOSIS — N3090 Cystitis, unspecified without hematuria: Secondary | ICD-10-CM

## 2015-12-21 DIAGNOSIS — M545 Low back pain, unspecified: Secondary | ICD-10-CM

## 2015-12-21 DIAGNOSIS — M5030 Other cervical disc degeneration, unspecified cervical region: Secondary | ICD-10-CM | POA: Insufficient documentation

## 2015-12-21 DIAGNOSIS — N3091 Cystitis, unspecified with hematuria: Secondary | ICD-10-CM | POA: Insufficient documentation

## 2015-12-21 DIAGNOSIS — F17210 Nicotine dependence, cigarettes, uncomplicated: Secondary | ICD-10-CM | POA: Insufficient documentation

## 2015-12-21 HISTORY — DX: Other cervical disc displacement, unspecified cervical region: M50.20

## 2015-12-21 LAB — URINALYSIS COMPLETE WITH MICROSCOPIC (ARMC ONLY)
Bacteria, UA: NONE SEEN
Bilirubin Urine: NEGATIVE
GLUCOSE, UA: NEGATIVE mg/dL
KETONES UR: NEGATIVE mg/dL
LEUKOCYTES UA: NEGATIVE
NITRITE: NEGATIVE
Protein, ur: 100 mg/dL — AB
SPECIFIC GRAVITY, URINE: 1.027 (ref 1.005–1.030)
Squamous Epithelial / LPF: NONE SEEN
pH: 5 (ref 5.0–8.0)

## 2015-12-21 LAB — BASIC METABOLIC PANEL
ANION GAP: 6 (ref 5–15)
BUN: 9 mg/dL (ref 6–20)
CALCIUM: 8.8 mg/dL — AB (ref 8.9–10.3)
CO2: 27 mmol/L (ref 22–32)
Chloride: 102 mmol/L (ref 101–111)
Creatinine, Ser: 0.74 mg/dL (ref 0.61–1.24)
Glucose, Bld: 117 mg/dL — ABNORMAL HIGH (ref 65–99)
Potassium: 3.7 mmol/L (ref 3.5–5.1)
Sodium: 135 mmol/L (ref 135–145)

## 2015-12-21 LAB — CBC
HEMATOCRIT: 37 % — AB (ref 40.0–52.0)
Hemoglobin: 11.6 g/dL — ABNORMAL LOW (ref 13.0–18.0)
MCH: 19.3 pg — ABNORMAL LOW (ref 26.0–34.0)
MCHC: 31.5 g/dL — ABNORMAL LOW (ref 32.0–36.0)
MCV: 61.3 fL — ABNORMAL LOW (ref 80.0–100.0)
PLATELETS: 361 10*3/uL (ref 150–440)
RBC: 6.03 MIL/uL — ABNORMAL HIGH (ref 4.40–5.90)
RDW: 14.4 % (ref 11.5–14.5)
WBC: 12.6 10*3/uL — AB (ref 3.8–10.6)

## 2015-12-21 MED ORDER — SULFAMETHOXAZOLE-TRIMETHOPRIM 800-160 MG PO TABS: 1.0000 | ORAL_TABLET | Freq: Two times a day (BID) | ORAL | Status: AC

## 2015-12-21 MED ORDER — SULFAMETHOXAZOLE-TRIMETHOPRIM 800-160 MG PO TABS
1.0000 | ORAL_TABLET | Freq: Once | ORAL | Status: AC
  Administered 2015-12-21: 1 via ORAL
  Filled 2015-12-21: qty 1

## 2015-12-21 MED ORDER — OXYCODONE-ACETAMINOPHEN 5-325 MG PO TABS
2.0000 | ORAL_TABLET | Freq: Once | ORAL | Status: AC
Start: 2015-12-21 — End: 2015-12-21
  Administered 2015-12-21: 2 via ORAL
  Filled 2015-12-21: qty 2

## 2015-12-21 MED ORDER — OXYCODONE-ACETAMINOPHEN 5-325 MG PO TABS: 2.0000 | ORAL_TABLET | Freq: Four times a day (QID) | ORAL | Status: AC | PRN

## 2015-12-21 NOTE — ED Provider Notes (Signed)
[facility] Emergency Department Provider Note        Time seen: ----------------------------------------- 9:19 PM on 12/21/2015 -----------------------------------------    I have reviewed the triage vital signs and the nursing notes.   HISTORY  Chief Complaint Hematuria    HPI Craig Ellis is a 30 y.o. male who presents to ER for hematuria, lower back, and lower abdominal pain for several days. Nothing makes it better or worse. Patient denies a history of this before.   Past Medical History  Diagnosis Date  . Degenerative disc disease, cervical   . Fracture     T11 and T 12 fracture august 2016  . Degenerative disc disease, cervical   . Cervical disc herniation     There are no active problems to display for this patient.   History reviewed. No pertinent past surgical history.  Allergies Toradol and Tramadol  Social History Social History  Substance Use Topics  . Smoking status: Current Every Day Smoker -- 1.00 packs/day    Types: Cigarettes  . Smokeless tobacco: None  . Alcohol Use: No    Review of Systems Constitutional: Negative for fever. Cardiovascular: Negative for chest pain. Respiratory: Negative for shortness of breath. Gastrointestinal: Positive for flank pain Genitourinary: Positive for hematuria Musculoskeletal: Negative for back pain. Skin: Negative for rash. Neurological: Negative for headaches, focal weakness or numbness.  10-point ROS otherwise negative.  ____________________________________________   PHYSICAL EXAM:  VITAL SIGNS: ED Triage Vitals  Enc Vitals Group     BP 12/21/15 2003 113/73 mmHg     Pulse Rate 12/21/15 2003 82     Resp 12/21/15 2003 18     Temp 12/21/15 2003 98.4 F (36.9 C)     Temp Source 12/21/15 2003 Oral     SpO2 12/21/15 2003 98 %     Weight 12/21/15 2003 132 lb (59.875 kg)     Height 12/21/15 2003  (1.626 m)     Head Cir --      Peak Flow --      Pain Score  12/21/15 1959 7     Pain Loc --      Pain Edu? --      Excl. in GC? --     Constitutional: Alert and oriented. Well appearing and in no distress. Eyes: Conjunctivae are normal. PERRL. Normal extraocular movements. ENT   Head: Normocephalic and atraumatic.   Nose: No congestion/rhinnorhea.   Mouth/Throat: Mucous membranes are moist.   Neck: No stridor. Cardiovascular: Normal rate, regular rhythm. No murmurs, rubs, or gallops. Respiratory: Normal respiratory effort without tachypnea nor retractions. Breath sounds are clear and equal bilaterally. No wheezes/rales/rhonchi. Gastrointestinal: Soft and nontender. Normal bowel sounds Musculoskeletal: Nontender with normal range of motion in all extremities. No lower extremity tenderness nor edema. Neurologic:  Normal speech and language. No gross focal neurologic deficits are appreciated.  Skin:  Skin is warm, dry and intact. No rash noted. Psychiatric: Mood and affect are normal. Speech and behavior are normal.  ____________________________________________  ED COURSE:  Pertinent labs & imaging results that were available during my care of the patient were reviewed by me and considered in my medical decision making (see chart for details). Patient is in no acute distress, will check basic labs, consider CT imaging. ____________________________________________    LABS (pertinent positives/negatives)  Labs Reviewed  CBC - Abnormal; Notable for the following:    WBC 12.6 (*)    RBC 6.03 (*)    Hemoglobin 11.6 (*)  HCT 37.0 (*)    MCV 61.3 (*)    MCH 19.3 (*)    MCHC 31.5 (*)    All other components within normal limits  BASIC METABOLIC PANEL - Abnormal; Notable for the following:    Glucose, Bld 117 (*)    Calcium 8.8 (*)    All other components within normal limits  URINALYSIS COMPLETEWITH MICROSCOPIC ([facility] ONLY) - Abnormal; Notable for the following:    Color, Urine YELLOW (*)    APPearance CLOUDY (*)    Hgb  urine dipstick 3+ (*)    Protein, ur 100 (*)    All other components within normal limits  URINE CULTURE    RADIOLOGY Images were viewed by me  CT renal protocol  IMPRESSION: No evidence renal/ureteral stones or obstruction. No acute findings in the abdomen/pelvis. ____________________________________________  FINAL ASSESSMENT AND PLAN  Flank pain, cystitis  Plan: Patient with labs and imaging as dictated above. Unclear etiology for his symptoms. Possibly recently passed kidney stone. Patient will be covered for cystitis although I'm unclear why he would have this diagnosis. He'll be discharged with antibiotics and pain medicine and encouraged close follow-up with his doctor.   Emily FilbertWilliams, Jonathan E, MD   Note: This dictation was prepared with Dragon dictation. Any transcriptional errors that result from this process are unintentional   Emily FilbertJonathan E Williams, MD 12/21/15 2244

## 2015-12-21 NOTE — Discharge Instructions (Signed)
Back Pain, Adult °Back pain is very common in adults. The cause of back pain is rarely dangerous and the pain often gets better over time. The cause of your back pain may not be known. Some common causes of back pain include: °· Strain of the muscles or ligaments supporting the spine. °· Wear and tear (degeneration) of the spinal disks. °· Arthritis. °· Direct injury to the back. °For many people, back pain may return. Since back pain is rarely dangerous, most people can learn to manage this condition on their own. °HOME CARE INSTRUCTIONS °Watch your back pain for any changes. The following actions may help to lessen any discomfort you are feeling: °· Remain active. It is stressful on your back to sit or stand in one place for long periods of time. Do not sit, drive, or stand in one place for more than 30 minutes at a time. Take short walks on even surfaces as soon as you are able. Try to increase the length of time you walk each day. °· Exercise regularly as directed by your health care provider. Exercise helps your back heal faster. It also helps avoid future injury by keeping your muscles strong and flexible. °· Do not stay in bed. Resting more than 1-2 days can delay your recovery. °· Pay attention to your body when you bend and lift. The most comfortable positions are those that put less stress on your recovering back. Always use proper lifting techniques, including: °· Bending your knees. °· Keeping the load close to your body. °· Avoiding twisting. °· Find a comfortable position to sleep. Use a firm mattress and lie on your side with your knees slightly bent. If you lie on your back, put a pillow under your knees. °· Avoid feeling anxious or stressed. Stress increases muscle tension and can worsen back pain. It is important to recognize when you are anxious or stressed and learn ways to manage it, such as with exercise. °· Take medicines only as directed by your health care provider. Over-the-counter  medicines to reduce pain and inflammation are often the most helpful. Your health care provider may prescribe muscle relaxant drugs. These medicines help dull your pain so you can more quickly return to your normal activities and healthy exercise. °· Apply ice to the injured area: °· Put ice in a plastic bag. °· Place a towel between your skin and the bag. °· Leave the ice on for 20 minutes, 2-3 times a day for the first 2-3 days. After that, ice and heat may be alternated to reduce pain and spasms. °· Maintain a healthy weight. Excess weight puts extra stress on your back and makes it difficult to maintain good posture. °SEEK MEDICAL CARE IF: °· You have pain that is not relieved with rest or medicine. °· You have increasing pain going down into the legs or buttocks. °· You have pain that does not improve in one week. °· You have night pain. °· You lose weight. °· You have a fever or chills. °SEEK IMMEDIATE MEDICAL CARE IF:  °· You develop new bowel or bladder control problems. °· You have unusual weakness or numbness in your arms or legs. °· You develop nausea or vomiting. °· You develop abdominal pain. °· You feel faint. °  °This information is not intended to replace advice given to you by your health care provider. Make sure you discuss any questions you have with your health care provider. °  °Document Released: 07/17/2005 Document Revised: 08/07/2014 Document Reviewed: 11/18/2013 °Elsevier Interactive Patient Education ©2016 Elsevier   Inc.  Urinary Tract Infection A urinary tract infection (UTI) can occur any place along the urinary tract. The tract includes the kidneys, ureters, bladder, and urethra. A type of germ called bacteria often causes a UTI. UTIs are often helped with antibiotic medicine.  HOME CARE   If given, take antibiotics as told by your doctor. Finish them even if you start to feel better.  Drink enough fluids to keep your pee (urine) clear or pale yellow.  Avoid tea, drinks with  caffeine, and bubbly (carbonated) drinks.  Pee often. Avoid holding your pee in for a long time.  Pee before and after having sex (intercourse).  Wipe from front to back after you poop (bowel movement) if you are a woman. Use each tissue only once. GET HELP RIGHT AWAY IF:   You have back pain.  You have lower belly (abdominal) pain.  You have chills.  You feel sick to your stomach (nauseous).  You throw up (vomit).  Your burning or discomfort with peeing does not go away.  You have a fever.  Your symptoms are not better in 3 days. MAKE SURE YOU:   Understand these instructions.  Will watch your condition.  Will get help right away if you are not doing well or get worse.   This information is not intended to replace advice given to you by your health care provider. Make sure you discuss any questions you have with your health care provider.   Document Released: 01/03/2008 Document Revised: 08/07/2014 Document Reviewed: 02/15/2012 Elsevier Interactive Patient Education [employer]2016 Elsevier Inc.

## 2015-12-21 NOTE — ED Notes (Signed)
Pt states they sent urine already.

## 2015-12-21 NOTE — ED Notes (Signed)
Patient ambulatory to triage with steady gait, without difficulty or distress noted; pt reports lower back, lower abd pain and hematuria last several days

## 2015-12-23 LAB — URINE CULTURE: SPECIAL REQUESTS: NORMAL

## 2016-02-06 ENCOUNTER — Emergency Department: Payer: BLUE CROSS/BLUE SHIELD

## 2016-02-06 ENCOUNTER — Encounter: Payer: Self-pay | Admitting: Emergency Medicine

## 2016-02-06 ENCOUNTER — Emergency Department
Admission: EM | Admit: 2016-02-06 | Discharge: 2016-02-06 | Disposition: A | Discharge status: Home / Self Care | Payer: BLUE CROSS/BLUE SHIELD | Attending: Student | Admitting: Student

## 2016-02-06 DIAGNOSIS — M542 Cervicalgia: Secondary | ICD-10-CM | POA: Diagnosis present

## 2016-02-06 DIAGNOSIS — G8918 Other acute postprocedural pain: Secondary | ICD-10-CM

## 2016-02-06 DIAGNOSIS — M5030 Other cervical disc degeneration, unspecified cervical region: Secondary | ICD-10-CM | POA: Diagnosis not present

## 2016-02-06 DIAGNOSIS — Z79891 Long term (current) use of opiate analgesic: Secondary | ICD-10-CM | POA: Diagnosis not present

## 2016-02-06 DIAGNOSIS — F17210 Nicotine dependence, cigarettes, uncomplicated: Secondary | ICD-10-CM | POA: Insufficient documentation

## 2016-02-06 DIAGNOSIS — M79622 Pain in left upper arm: Secondary | ICD-10-CM | POA: Diagnosis not present

## 2016-02-06 DIAGNOSIS — R52 Pain, unspecified: Secondary | ICD-10-CM

## 2016-02-06 DIAGNOSIS — Z79899 Other long term (current) drug therapy: Secondary | ICD-10-CM | POA: Diagnosis not present

## 2016-02-06 DIAGNOSIS — R079 Chest pain, unspecified: Secondary | ICD-10-CM | POA: Diagnosis not present

## 2016-02-06 LAB — CBC
HEMATOCRIT: 37.4 % — AB (ref 40.0–52.0)
HEMOGLOBIN: 12.1 g/dL — AB (ref 13.0–18.0)
MCH: 19.4 pg — AB (ref 26.0–34.0)
MCHC: 32.3 g/dL (ref 32.0–36.0)
MCV: 60.1 fL — AB (ref 80.0–100.0)
Platelets: 393 10*3/uL (ref 150–440)
RBC: 6.22 MIL/uL — AB (ref 4.40–5.90)
RDW: 14.8 % — ABNORMAL HIGH (ref 11.5–14.5)
WBC: 12.4 10*3/uL — ABNORMAL HIGH (ref 3.8–10.6)

## 2016-02-06 LAB — BASIC METABOLIC PANEL
ANION GAP: 7 (ref 5–15)
BUN: 7 mg/dL (ref 6–20)
CHLORIDE: 103 mmol/L (ref 101–111)
CO2: 27 mmol/L (ref 22–32)
Calcium: 9.3 mg/dL (ref 8.9–10.3)
Creatinine, Ser: 0.63 mg/dL (ref 0.61–1.24)
GFR calc non Af Amer: >60 mL/min (ref >60)
GLUCOSE: 132 mg/dL — AB (ref 65–99)
POTASSIUM: 3.7 mmol/L (ref 3.5–5.1)
Sodium: 137 mmol/L (ref 135–145)

## 2016-02-06 LAB — HEPATIC FUNCTION PANEL
ALK PHOS: 78 U/L (ref 38–126)
ALT: 18 U/L (ref 17–63)
AST: 20 U/L (ref 15–41)
Albumin: 4.3 g/dL (ref 3.5–5.0)
BILIRUBIN TOTAL: 0.8 mg/dL (ref 0.3–1.2)
Bilirubin, Direct: <0.1 mg/dL — ABNORMAL LOW (ref 0.1–0.5)
TOTAL PROTEIN: 7.6 g/dL (ref 6.5–8.1)

## 2016-02-06 LAB — TROPONIN I: Troponin I: <0.03 ng/mL (ref <0.03)

## 2016-02-06 LAB — ACETAMINOPHEN LEVEL: ACETAMINOPHEN (TYLENOL), SERUM: 13 ug/mL (ref 10–30)

## 2016-02-06 MED ORDER — IOPAMIDOL (ISOVUE-370) INJECTION 76%
75.0000 mL | Freq: Once | INTRAVENOUS | Status: AC | PRN
  Administered 2016-02-06: 75 mL via INTRAVENOUS

## 2016-02-06 MED ORDER — HYDROMORPHONE HCL 1 MG/ML IJ SOLN
1.0000 mg | Freq: Once | INTRAMUSCULAR | Status: AC
  Administered 2016-02-06: 1 mg via INTRAVENOUS
  Filled 2016-02-06: qty 1

## 2016-02-06 MED ORDER — SODIUM CHLORIDE 0.9 % IV BOLUS (SEPSIS)
1000.0000 mL | Freq: Once | INTRAVENOUS | Status: AC
  Administered 2016-02-06: 1000 mL via INTRAVENOUS

## 2016-02-06 NOTE — ED Notes (Signed)
Pt comes into the ED via POV c/o clavicular pain that radiates from side to side.  Patient recently had C5-and C6 fused on 7/29.  Patient is now experiencing pain and discomfort through the clavicles and neck.  He also c/o of pain in the left arm from where he has been using his arms to push up off of things in order to not strain his neck.  Patient in NAD at this time with even and unlabored respirations.

## 2016-02-06 NOTE — ED Provider Notes (Addendum)
[facility] Emergency Department Provider Note   ____________________________________________  Time seen: Approximately 5:06 PM  I have reviewed the triage vital signs and the nursing notes.   HISTORY  Chief Complaint Post-op Problem and Hand Pain    HPI Craig Ellis is a 30 y.o. male status post C3-C6 ACDF on 01/27/2016 done by Dr. Cameron [facility] who presents for evaluation of left hand/arm pain radiating into his neck and bilateral upper chest ongoing for the past week, gradual onset, constant, no modifying factors, he describes it as severe. The patient reports that earlier this week he was trying to push off of something using his hands in an effort not to strain his neck and he developed left hand pain. The pain in the upper chest is just below his clavicles and has been constant. It is not worse with exertion, not necessarily pleuritic. It is not radiating. No fevers or chills, no shortness of breath, no voice change. He reports that he has taken 60x 5  mg hydrocodone tablets since his operation and they have not been helpful for his pain. He also reports that he is taking a significant amount of Tylenol, by his report 5500 mg tablets every 4-6 hours but this has not been helpful for his pain. No fevers or chills. No numbness or weakness.    See telephone note below from care everywhere [facility] on 02/05/16: S/p C3-C6 ACDF on 01/28/16 [facility]. He called today because he has had this consistent left arm radiating pain from his elbow into his small and ring finger. He had this pain postop day 3, but he feels it is consistent and gotten worse. He is also having upper chest and neck pain consistently. He is calling to determine whether he should come to the emergency department now or anything else he should be doing. He was previously recommended to take a Medrol Dosepak, but never picked it up. I spoke with Dr. Cameron Aliavanaugh who recommended  that he take the Medrol Dosepak and it is also okay to take gabapentin 3 times a day. I have sent him a prescription for gabapentin, and told him that if his symptoms become unbearable he should come to the emergency department or call the clinic, especially if he starts developing weakness, fevers or chills. The patient was in understanding and agreeable with this plan.     Past Medical History  Diagnosis Date  . Degenerative disc disease, cervical   . Fracture     T11 and T 12 fracture august 2016  . Degenerative disc disease, cervical   . Cervical disc herniation     There are no active problems to display for this patient.   Past Surgical History  Procedure Laterality Date  . Cervical fusion      Current Outpatient Rx  Name  Route  Sig  Dispense  Refill  . acetaminophen (TYLENOL) 500 MG tablet   Oral   Take 2,000 mg by mouth every 6 (six) hours as needed. For pain/headache.         . docusate sodium (COLACE) 100 MG capsule   Oral   Take 100 mg by mouth 2 (two) times daily.         Marland Kitchen. oxyCODONE (OXY IR/ROXICODONE) 5 MG immediate release tablet   Oral   Take 5 mg by mouth every 6 (six) hours as needed. For pain up to 10 days         .  naproxen (NAPROSYN) 500 MG tablet   Oral   Take 1 tablet (500 mg total) by mouth 2 (two) times daily with a meal.   30 tablet   0   . oxyCODONE-acetaminophen (PERCOCET) 5-325 MG tablet   Oral   Take 2 tablets by mouth every 6 (six) hours as needed for moderate pain or severe pain.   12 tablet   0   . sulfamethoxazole-trimethoprim (BACTRIM DS) 800-160 MG tablet   Oral   Take 1 tablet by mouth 2 (two) times daily.   14 tablet   0     Allergies Flexeril; Toradol; and Tramadol  No family history on file.  Social History Social History  Substance Use Topics  . Smoking status: Current Every Day Smoker -- 1.00 packs/day    Types: Cigarettes  . Smokeless tobacco: None  . Alcohol Use: No    Review of  Systems Constitutional: No fever/chills Eyes: No visual changes. ENT: No sore throat. Cardiovascular: + chest pain. Respiratory: Denies shortness of breath. Gastrointestinal: No abdominal pain.  No nausea, no vomiting.  No diarrhea.  No constipation. Genitourinary: Negative for dysuria. Musculoskeletal: Negative for back pain. Skin: Negative for rash. Neurological: Negative for headaches, focal weakness or numbness.  10-point ROS otherwise negative.  ____________________________________________   PHYSICAL EXAM:  VITAL SIGNS: ED Triage Vitals  Enc Vitals Group     BP 02/06/16 1617 138/97 mmHg     Pulse Rate 02/06/16 1617 97     Resp 02/06/16 1617 16     Temp 02/06/16 1617 98.6 F (37 C)     Temp Source 02/06/16 1617 Oral     SpO2 02/06/16 1617 99 %     Weight 02/06/16 1617 130 lb (58.968 kg)     Height 02/06/16 1617  (1.626 m)     Head Cir --      Peak Flow --      Pain Score 02/06/16 1618 5     Pain Loc --      Pain Edu? --      Excl. in GC? --     Constitutional: Alert and oriented. Well appearing and in no acute distress. Eyes: Conjunctivae are normal. PERRL. EOMI. Head: Atraumatic. Nose: No congestion/rhinnorhea. Mouth/Throat: Mucous membranes are moist.  Oropharynx non-erythematous. Handling secretions well, no voice change or drooling. Neck: No stridor, No wheeze, no anterior hematoma. Incision in the anterior neck is healing well, closed with Dermabond, no surrounding erythema, no drainage, and no fluctuant mass. Cardiovascular: Normal rate, regular rhythm. Grossly normal heart sounds.  Good peripheral circulation. Respiratory: Normal respiratory effort.  No retractions. Lungs CTAB. Gastrointestinal: Soft and nontender. No distention.  No CVA tenderness. Genitourinary: Deferred Musculoskeletal: No lower extremity tenderness nor edema.  No joint effusions. 2+ left radial pulse. Tender to palpation throughout the upper chest wall. No tenderness throughout the  left hand, no swelling. Neurologic:  Normal speech and language. No gross focal neurologic deficits are appreciated. No gait instability. 5 out of 5 strength in bilateral upper and lower extremity, sensation intact to light touch throughout, cranial nerves II through XII intact. Skin:  Skin is warm, dry and intact. No rash noted. Psychiatric: Mood and affect are normal. Speech and behavior are normal.  ____________________________________________   LABS (all labs ordered are listed, but only abnormal results are displayed)  Labs Reviewed  BASIC METABOLIC PANEL - Abnormal; Notable for the following:    Glucose, Bld 132 (*)    All other components within normal  limits  CBC - Abnormal; Notable for the following:    WBC 12.4 (*)    RBC 6.22 (*)    Hemoglobin 12.1 (*)    HCT 37.4 (*)    MCV 60.1 (*)    MCH 19.4 (*)    RDW 14.8 (*)    All other components within normal limits  HEPATIC FUNCTION PANEL - Abnormal; Notable for the following:    Bilirubin, Direct <0.1 (*)    All other components within normal limits  ACETAMINOPHEN LEVEL  TROPONIN I   ____________________________________________  EKG  ED ECG REPORT I, Gayla Doss, the attending physician, personally viewed and interpreted this ECG.   Date: 02/06/2016  EKG Time: 17:39  Rate: 81  Rhythm: normal sinus rhythm  Axis: normal  Intervals:none  ST&T Change: No acute ST elevation or acute ST depression. T-wave inversion in lead 3.  ____________________________________________  RADIOLOGY  Xray left hand IMPRESSION: No acute radiographic abnormality of the left hand.  CT c-spine FINDINGS: There has been interval placement of a C5-C6 disc spacer and anterior fusion plate and screws. Stop there is no acute fracture or subluxation of the cervical spine. A tiny bone spur noted from the posterior superior corner of the C6. The odontoid and spinous processes are intact.There is normal anatomic alignment of the  C1-C2 lateral masses. The visualized soft tissues appear unremarkable.  IMPRESSION: No acute fracture or subluxation.  C5-C6 disc spacer and anterior fusion hardware appear intact.  CTA chest IMPRESSION: No acute intrathoracic pathology. No CT evidence of pulmonary embolism. ____________________________________________   PROCEDURES  Procedure(s) performed: None  Procedures  Critical Care performed: No  ____________________________________________   INITIAL IMPRESSION / ASSESSMENT AND PLAN / ED COURSE  Pertinent labs & imaging results that were available during my care of the patient were reviewed by me and considered in my medical decision making (see chart for details).  Craig Ellis is a 30 y.o. male status post C3-C6 ACDF on 01/27/2016 done by Dr. Cameron Ali at [facility] who presents for evaluation of left hand/arm pain radiating into his neck and bilateral upper chest ongoing for the past week. On exam, he is well-appearing and in no acute distress, vital signs are stable, he is afebrile. He is an intact neurological examination, equal grip strength bilaterally, he is neurovascularly intact in both arms. He does also have what appears to be reproducible anterior chest wall pain however given his recent operations an elevated risk for PE will obtain CTA chest as well as CT C-spine to evaluate hardware. I reviewed his labs. CBC shows mild anemia, mild leukocytosis. Unremarkable CMP. Acetaminophen level is only 13, I discussed with him that he is misusing Tylenol and we discussed appropriate dosing. Troponin pending though I suspect that this will be musculoskeletal chest pain. We'll treat his pain and reassess for disposition.  ----------------------------------------- 7:56 PM on 02/06/2016 ----------------------------------------- Patient with significant improvement of his pain at this time. Troponin negative, I doubt that his chest pain represents ACS, likely musculoskeletal  pain. CT injury or chest shows no PE. CT of the C-spine shows intact hardware. Plain films of the hand are negative. Discussed that he needs to follow-up with his orthopedic surgeon as soon as possible. I will not prescribe narcotics from the emergency department and I suspect he has been overusing his narcotic pain medications. His orthopedic surgeon has already called in a prescription for gabapentin and a Medrol Dosepak, recommended he fill those prescriptions and take those medicines as prescribed. DC  with return precautions and close follow-up. He is comfortable with the discharge plan.  ____________________________________________   FINAL CLINICAL IMPRESSION(S) / ED DIAGNOSES  Final diagnoses:  Pain  Neck pain  Chest pain at rest  Acute post-operative pain      NEW MEDICATIONS STARTED DURING THIS VISIT:  New Prescriptions   No medications on file     Note:  This document was prepared using Dragon voice recognition software and may include unintentional dictation errors.    Gayla Doss, MD 02/06/16 4098  Gayla Doss, MD 02/06/16 2000

## 2016-07-05 ENCOUNTER — Encounter: Payer: Self-pay | Admitting: Emergency Medicine

## 2016-07-05 ENCOUNTER — Emergency Department
Admission: EM | Admit: 2016-07-05 | Discharge: 2016-07-05 | Disposition: A | Discharge status: Home / Self Care | Payer: Self-pay | Attending: Emergency Medicine | Admitting: Emergency Medicine

## 2016-07-05 ENCOUNTER — Emergency Department: Payer: Self-pay

## 2016-07-05 DIAGNOSIS — Z791 Long term (current) use of non-steroidal anti-inflammatories (NSAID): Secondary | ICD-10-CM | POA: Insufficient documentation

## 2016-07-05 DIAGNOSIS — Z79899 Other long term (current) drug therapy: Secondary | ICD-10-CM | POA: Insufficient documentation

## 2016-07-05 DIAGNOSIS — Y990 Civilian activity done for income or pay: Secondary | ICD-10-CM | POA: Insufficient documentation

## 2016-07-05 DIAGNOSIS — Y9289 Other specified places as the place of occurrence of the external cause: Secondary | ICD-10-CM | POA: Insufficient documentation

## 2016-07-05 DIAGNOSIS — F17210 Nicotine dependence, cigarettes, uncomplicated: Secondary | ICD-10-CM | POA: Insufficient documentation

## 2016-07-05 DIAGNOSIS — W1809XA Striking against other object with subsequent fall, initial encounter: Secondary | ICD-10-CM | POA: Insufficient documentation

## 2016-07-05 DIAGNOSIS — S20212A Contusion of left front wall of thorax, initial encounter: Secondary | ICD-10-CM | POA: Insufficient documentation

## 2016-07-05 DIAGNOSIS — Y9389 Activity, other specified: Secondary | ICD-10-CM | POA: Insufficient documentation

## 2016-07-05 MED ORDER — BACLOFEN 10 MG PO TABS: 10.0000 mg | ORAL_TABLET | Freq: Three times a day (TID) | ORAL | 1 refills | Status: AC

## 2016-07-05 NOTE — ED Triage Notes (Signed)
Pt to ed with c/o left rib pain.  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  Pt reports since he has had increased pain with breathing and movement.  Pt able to speak easily at triage.

## 2016-07-05 NOTE — ED Provider Notes (Signed)
[facility] Emergency Department Provider Note ____________________________________________  Time seen: Approximately 2:40 PM  I have reviewed the triage vital signs and the nursing notes.   HISTORY  Chief Complaint RIB PAIN    HPI Craig Ellis is a 30 y.o. male who presents to the emergency department for evaluation of chest wall pain after a 2x12 and some subfloor fell and struck him on the left side while at work. No shortness of breath. He has pain with deep inspiration and coughing. He has not taken anything for pain since the incident.    Past Medical History:  Diagnosis Date  . Cervical disc herniation   . Degenerative disc disease, cervical   . Degenerative disc disease, cervical   . Fracture    T11 and T 12 fracture august 2016    There are no active problems to display for this patient.   Past Surgical History:  Procedure Laterality Date  . CERVICAL FUSION      Prior to Admission medications   Medication Sig Start Date End Date Taking? Authorizing Provider  acetaminophen (TYLENOL) 500 MG tablet Take 2,000 mg by mouth every 6 (six) hours as needed. For pain/headache.    Historical Provider, MD  baclofen (LIORESAL) 10 MG tablet Take 1 tablet (10 mg total) by mouth 3 (three) times daily. 07/05/16 07/05/17  Chinita Pesterari B Kaesen Rodriguez, FNP  naproxen (NAPROSYN) 500 MG tablet Take 1 tablet (500 mg total) by mouth 2 (two) times daily with a meal. 11/01/15   Tommi Rumpshonda L Summers, PA-C  oxyCODONE-acetaminophen (PERCOCET) 5-325 MG tablet Take 2 tablets by mouth every 6 (six) hours as needed for moderate pain or severe pain. 12/21/15   Emily FilbertJonathan E Williams, MD  sulfamethoxazole-trimethoprim (BACTRIM DS) 800-160 MG tablet Take 1 tablet by mouth 2 (two) times daily. 12/21/15   Emily FilbertJonathan E Williams, MD    Allergies Flexeril [cyclobenzaprine]; Toradol [ketorolac tromethamine]; and Tramadol  History reviewed. No pertinent family history.  Social History Social History   Substance Use Topics  . Smoking status: Current Every Day Smoker    Packs/day: 1.00    Types: Cigarettes  . Smokeless tobacco: Never Used  . Alcohol use No    Review of Systems Constitutional: No recent illness. Cardiovascular: Denies chest pain or palpitations. Respiratory: Denies shortness of breath. Musculoskeletal: Pain in left chest wall. Skin: Negative for rash, wound, lesion. Neurological: Negative for focal weakness or numbness.  ____________________________________________   PHYSICAL EXAM:  VITAL SIGNS: ED Triage Vitals [07/05/16 1416]  Enc Vitals Group     BP 116/71     Pulse Rate 68     Resp 20     Temp 98 F (36.7 C)     Temp Source Oral     SpO2 100 %     Weight 135 lb (61.2 kg)     Height 5\' 4"  (1.626 m)     Head Circumference      Peak Flow      Pain Score 8     Pain Loc      Pain Edu?      Excl. in GC?     Constitutional: Alert and oriented. Well appearing and in no acute distress. Eyes: Conjunctivae are normal. EOMI. Head: Atraumatic. Neck: No stridor.  Respiratory: Normal respiratory effort. Breath sounds clear throughout. Musculoskeletal: Point tenderness to the left chest wall without flail segment. Neurologic:  Normal speech and language. No gross focal neurologic deficits are appreciated. Speech is normal. No gait instability. Skin:  Skin is warm, dry and intact. Atraumatic. Psychiatric: Mood and affect are normal. Speech and behavior are normal.  ____________________________________________   LABS (all labs ordered are listed, but only abnormal results are displayed)  Labs Reviewed - No data to display ____________________________________________  RADIOLOGY  Chest xray negative for acute abnormality per radiology. ____________________________________________   PROCEDURES  Procedure(s) performed: None ____________________________________________   INITIAL IMPRESSION / ASSESSMENT AND PLAN / ED COURSE  Clinical Course      Pertinent labs & imaging results that were available during my care of the patient were reviewed by me and considered in my medical decision making (see chart for details).  30 year old male presenting with acute left chest wall pain after a 2x12 fell and hit him then subsequently some subflooring fell behind the 2x12. No pneumothorax identified on the chest x-ray. No displaced rib fractures visible. He will be treated with baclofen and advised to take tylenol in addition if needed as he is allergic to NSAIDs and Flexeril. He is to follow up with the PCP of his choice for symptoms that are not improving over the week or return to the ER for symptoms that change or worsen if unable to schedule an appointment. ____________________________________________   FINAL CLINICAL IMPRESSION(S) / ED DIAGNOSES  Final diagnoses:  Rib contusion, left, initial encounter       Chinita PesterCari B Justen Fonda, FNP 07/07/16 1507    Jeanmarie PlantJames A McShane, MD 07/08/16 1114

## 2016-07-05 NOTE — ED Notes (Signed)
Patient here for pain management after a piece of lumber fell onto patients left side. Patient complains of left sided rib cage pain, worse with movement. Patient ambulatory to room.

## 2016-07-09 ENCOUNTER — Emergency Department
Admission: EM | Admit: 2016-07-09 | Discharge: 2016-07-09 | Disposition: A | Discharge status: Home / Self Care | Payer: BLUE CROSS/BLUE SHIELD | Attending: Emergency Medicine | Admitting: Emergency Medicine

## 2016-07-09 ENCOUNTER — Encounter: Payer: Self-pay | Admitting: Emergency Medicine

## 2016-07-09 DIAGNOSIS — Y9389 Activity, other specified: Secondary | ICD-10-CM | POA: Insufficient documentation

## 2016-07-09 DIAGNOSIS — Y990 Civilian activity done for income or pay: Secondary | ICD-10-CM | POA: Insufficient documentation

## 2016-07-09 DIAGNOSIS — S299XXA Unspecified injury of thorax, initial encounter: Secondary | ICD-10-CM | POA: Insufficient documentation

## 2016-07-09 DIAGNOSIS — Z5321 Procedure and treatment not carried out due to patient leaving prior to being seen by health care provider: Secondary | ICD-10-CM | POA: Insufficient documentation

## 2016-07-09 DIAGNOSIS — Y929 Unspecified place or not applicable: Secondary | ICD-10-CM | POA: Insufficient documentation

## 2016-07-09 DIAGNOSIS — W208XXA Other cause of strike by thrown, projected or falling object, initial encounter: Secondary | ICD-10-CM | POA: Insufficient documentation

## 2016-07-09 DIAGNOSIS — Z79899 Other long term (current) drug therapy: Secondary | ICD-10-CM | POA: Insufficient documentation

## 2016-07-09 DIAGNOSIS — F17210 Nicotine dependence, cigarettes, uncomplicated: Secondary | ICD-10-CM | POA: Insufficient documentation

## 2016-07-09 DIAGNOSIS — Z791 Long term (current) use of non-steroidal anti-inflammatories (NSAID): Secondary | ICD-10-CM | POA: Insufficient documentation

## 2016-07-09 NOTE — ED Notes (Signed)
Pt to front desk stating "this place is a joke, I'm leaving". Pt left without letting me speak with him regarding issues.

## 2016-07-09 NOTE — ED Notes (Signed)
AAOx3.  Chest excursion equal.  No SOB/ DOE.  Posture upright and relaxed.

## 2016-07-09 NOTE — ED Triage Notes (Addendum)
Seen on Tuesday after injuring self at work (2x12 floor joist and flooring fell onto left chest/ ribs). Patient states pain has worsened and he feels like there is "fluid on lungs".

## 2016-08-14 ENCOUNTER — Emergency Department
Admission: EM | Admit: 2016-08-14 | Discharge: 2016-08-14 | Discharge status: Against Medical Advice | Payer: BLUE CROSS/BLUE SHIELD

## 2016-08-25 ENCOUNTER — Emergency Department
Admission: EM | Admit: 2016-08-25 | Discharge: 2016-08-25 | Disposition: A | Discharge status: Home / Self Care | Payer: Self-pay | Attending: Emergency Medicine | Admitting: Emergency Medicine

## 2016-08-25 ENCOUNTER — Emergency Department: Payer: Self-pay

## 2016-08-25 DIAGNOSIS — Z79899 Other long term (current) drug therapy: Secondary | ICD-10-CM | POA: Insufficient documentation

## 2016-08-25 DIAGNOSIS — R1032 Left lower quadrant pain: Secondary | ICD-10-CM | POA: Insufficient documentation

## 2016-08-25 DIAGNOSIS — F17210 Nicotine dependence, cigarettes, uncomplicated: Secondary | ICD-10-CM | POA: Insufficient documentation

## 2016-08-25 DIAGNOSIS — R319 Hematuria, unspecified: Secondary | ICD-10-CM | POA: Insufficient documentation

## 2016-08-25 LAB — URINALYSIS, COMPLETE (UACMP) WITH MICROSCOPIC
Bacteria, UA: NONE SEEN
Bilirubin Urine: NEGATIVE
GLUCOSE, UA: NEGATIVE mg/dL
KETONES UR: NEGATIVE mg/dL
Leukocytes, UA: NEGATIVE
Nitrite: NEGATIVE
PH: 5 (ref 5.0–8.0)
Protein, ur: 30 mg/dL — AB
SPECIFIC GRAVITY, URINE: 1.024 (ref 1.005–1.030)

## 2016-08-25 LAB — BASIC METABOLIC PANEL
Anion gap: 6 (ref 5–15)
BUN: 11 mg/dL (ref 6–20)
CHLORIDE: 104 mmol/L (ref 101–111)
CO2: 27 mmol/L (ref 22–32)
CREATININE: 0.88 mg/dL (ref 0.61–1.24)
Calcium: 8.7 mg/dL — ABNORMAL LOW (ref 8.9–10.3)
GFR calc Af Amer: >60 mL/min (ref >60)
GFR calc non Af Amer: >60 mL/min (ref >60)
GLUCOSE: 95 mg/dL (ref 65–99)
POTASSIUM: 3.7 mmol/L (ref 3.5–5.1)
Sodium: 137 mmol/L (ref 135–145)

## 2016-08-25 LAB — CBC
HEMATOCRIT: 40.7 % (ref 40.0–52.0)
HEMOGLOBIN: 13 g/dL (ref 13.0–18.0)
MCH: 19.5 pg — AB (ref 26.0–34.0)
MCHC: 32 g/dL (ref 32.0–36.0)
MCV: 61.1 fL — AB (ref 80.0–100.0)
Platelets: 284 10*3/uL (ref 150–440)
RBC: 6.67 MIL/uL — AB (ref 4.40–5.90)
RDW: 14.6 % — ABNORMAL HIGH (ref 11.5–14.5)
WBC: 9.4 10*3/uL (ref 3.8–10.6)

## 2016-08-25 MED ORDER — ONDANSETRON HCL 4 MG/2ML IJ SOLN
4.0000 mg | Freq: Once | INTRAMUSCULAR | Status: AC
  Administered 2016-08-25: 4 mg via INTRAVENOUS
  Filled 2016-08-25: qty 2

## 2016-08-25 MED ORDER — HYDROMORPHONE HCL 2 MG PO TABS
2.0000 mg | ORAL_TABLET | Freq: Once | ORAL | Status: DC
Start: 2016-08-25 — End: 2016-08-25

## 2016-08-25 MED ORDER — HYDROMORPHONE HCL 2 MG PO TABS: 2.0000 mg | ORAL_TABLET | Freq: Two times a day (BID) | ORAL | 0 refills | Status: AC | PRN

## 2016-08-25 MED ORDER — ONDANSETRON HCL 4 MG/2ML IJ SOLN: 4.0000 mg | Freq: Once | INTRAMUSCULAR | Status: DC

## 2016-08-25 MED ORDER — HYDROMORPHONE HCL 1 MG/ML IJ SOLN
0.5000 mg | Freq: Once | INTRAMUSCULAR | Status: AC
  Administered 2016-08-25: 0.5 mg via INTRAVENOUS
  Filled 2016-08-25: qty 1

## 2016-08-25 MED ORDER — SODIUM CHLORIDE 0.9 % IV BOLUS (SEPSIS)
500.0000 mL | Freq: Once | INTRAVENOUS | Status: AC
  Administered 2016-08-25: 500 mL via INTRAVENOUS

## 2016-08-25 MED ORDER — HYDROMORPHONE HCL 2 MG PO TABS
ORAL_TABLET | ORAL | Status: AC
  Filled 2016-08-25: qty 1

## 2016-08-25 MED ORDER — HYDROMORPHONE HCL 1 MG/ML IJ SOLN
0.5000 mg | Freq: Once | INTRAMUSCULAR | Status: DC
Start: 2016-08-25 — End: 2016-08-25

## 2016-08-25 NOTE — ED Provider Notes (Signed)
Time Seen: Approximately 1432  I have reviewed the triage notes  Chief Complaint: Hematuria   History of Present Illness: Craig Ellis is a 31 y.o. male who states he's had some gross hematuria now for the past week and a half with occasional lower abdominal pain and left testicular pain. He states he's able to urinate to completion and states that the blood that he seen in the urine is fluctuated from being dark in appearance to "" like Kool-Aid "". The patient's not aware of any trauma. He denies any back or flank pain to this historian. No known history of hematuria or kidney stones. He denies any previous history of difficulty with his blood clot aggravating when he gets a cut. He denies any fever. He states he has checked his shower for masses in his testicle and has not noticed anything. He denies any difficulty with sexual intercourse. He can't tell me whether or not the blood is present and urine throughout the stream or at the beginning or end and states he's only seen it in the toilet.  Past Medical History:  Diagnosis Date  . Cervical disc herniation   . Degenerative disc disease, cervical   . Degenerative disc disease, cervical   . Fracture    T11 and T 12 fracture august 2016    There are no active problems to display for this patient.   Past Surgical History:  Procedure Laterality Date  . CERVICAL FUSION      Past Surgical History:  Procedure Laterality Date  . CERVICAL FUSION      Current Outpatient Rx  . Order #: [id number] Class: Historical Med  . Order #: [id number] Class: Print  . Order #: [id number] Class: Print  . Order #: [id number] Class: Print  . Order #: [id number] Class: Print  . Order #: [id number] Class: Print    Allergies:  Flexeril [cyclobenzaprine]; Toradol [ketorolac tromethamine]; and Tramadol  Family History: History reviewed. No pertinent family history.  Social History: Social History  Substance Use Topics  . Smoking status: Current  Every Day Smoker    Packs/day: 1.00    Types: Cigarettes  . Smokeless tobacco: Never Used  . Alcohol use No     Review of Systems:   10 point review of systems was performed and was otherwise negative:  Constitutional: No fever Eyes: No visual disturbances ENT: No sore throat, ear pain Cardiac: No chest pain Respiratory: No shortness of breath, wheezing, or stridor Abdomen: NoCurrent abdominal pain, he's noticed some nausea but no diarrhea or constipation no persistent vomiting. Endocrine: No weight loss, No night sweats Extremities: No peripheral edema, cyanosis Skin: No rashes, easy bruising Neurologic: No focal weakness, trouble with speech or swollowing Urologic: No dysuria, Gross hematuria described above, or urinary frequency   Physical Exam:  ED Triage Vitals  Enc Vitals Group     BP 08/25/16 1142 140/65     Pulse Rate 08/25/16 1142 (!) 107     Resp 08/25/16 1142 18     Temp 08/25/16 1142 97.7 F (36.5 C)     Temp Source 08/25/16 1142 Oral     SpO2 08/25/16 1142 95 %     Weight 08/25/16 1143 126 lb (57.2 kg)     Height 08/25/16 1143 5\' 5"  (1.651 m)     Head Circumference --      Peak Flow --      Pain Score 08/25/16 1143 8     Pain Loc --      Pain Edu? --  Excl. in GC? --     General: Awake , Alert , and Oriented times 3; GCS 15 Head: Normal cephalic , atraumatic Eyes: Pupils equal , round, reactive to light Nose/Throat: No nasal drainage, patent upper airway without erythema or exudate.  Neck: Supple, Full range of motion, No anterior adenopathy or palpable thyroid masses Lungs: Clear to ascultation without wheezes , rhonchi, or rales Heart: Regular rate, regular rhythm without murmurs , gallops , or rubs Abdomen: Soft, non tender without rebound, guarding , or rigidity; bowel sounds positive and symmetric in all 4 quadrants. No organomegaly .        Extremities: 2 plus symmetric pulses. No edema, clubbing or cyanosis Neurologic: normal ambulation,  Motor symmetric without deficits, sensory intact Skin: warm, dry, no rashes   Labs:   All laboratory work was reviewed including any pertinent negatives or positives listed below:  Labs Reviewed  URINALYSIS, COMPLETE (UACMP) WITH MICROSCOPIC - Abnormal; Notable for the following:       Result Value   Color, Urine YELLOW (*)    APPearance HAZY (*)    Hgb urine dipstick LARGE (*)    Protein, ur 30 (*)    Squamous Epithelial / LPF 0-5 (*)    All other components within normal limits  CBC - Abnormal; Notable for the following:    RBC 6.67 (*)    MCV 61.1 (*)    MCH 19.5 (*)    RDW 14.6 (*)    All other components within normal limits  BASIC METABOLIC PANEL - Abnormal; Notable for the following:    Calcium 8.7 (*)    All other components within normal limits    Radiology:   "Ct Renal Stone Study  Result Date: 08/25/2016 CLINICAL DATA:  Left groin and pelvic pain worsening over the past week. Hematuria. EXAM: CT ABDOMEN AND PELVIS WITHOUT CONTRAST TECHNIQUE: Multidetector CT imaging of the abdomen and pelvis was performed following the standard protocol without IV contrast. COMPARISON:  12/21/2015 FINDINGS: Lower chest: Unremarkable where included. Hepatobiliary: Unremarkable where included. Pancreas: Unremarkable Spleen: Unremarkable where included. Adrenals/Urinary Tract: Adrenal glands normal. No urinary tract calculi to explain the patient's reported hematuria. No obvious mass in the lumen of the urinary bladder. No contour abnormality of the kidneys. Stomach/Bowel: Appendix normal. Upper normal amount of stool in the colon. No dilated bowel. Vascular/Lymphatic: Unremarkable Reproductive: Unremarkable Other: No supplemental non-categorized findings. Musculoskeletal: Notable Schmorl's node along the superior endplate of T12 with some endplate irregularity at T11-12. Transitional S1 vertebra. Mild lower lumbar degenerative disc disease is suspected, but without overt impingement. No groin  hernia identified. IMPRESSION: 1. A cause for the patient's left groin pain is not identified. A cause for hematuria is not identified. If there is clinical suspicion for athletic pubalgia, orthopedic referral and/or MRI workup may be helpful. 2. Advanced for age degenerative disc disease and degenerative endplate findings at the T11-12 level, but not appreciably changed from the prior exam. Electronically Signed   By: Gaylyn Rong M.D.   On: 08/25/2016 15:21  "   I personally reviewed the radiologic studies    ED Course: * Essentially no obvious source for his hematuria at this time. The patient seems to have adequate pain control here with IV fluids, IV Dilaudid and Zofran etc. The patient was referred to urology unassigned for further outpatient follow-up and investigation.     Assessment:  Hematuria of unknown etiology  Final Clinical Impression:   Final diagnoses:  Left groin pain  Hematuria,  unspecified type     Plan:  Outpatient Patient was advised to return immediately if condition worsens. Patient was advised to follow up with their primary care physician or other specialized physicians involved in their outpatient care. The patient and/or family member/power of attorney had laboratory results reviewed at the bedside. All questions and concerns were addressed and appropriate discharge instructions were distributed by the nursing staff.             Jennye Moccasin, MD 08/25/16 [phone]

## 2016-08-25 NOTE — Discharge Instructions (Signed)
Return to the emergency department especially for fever, increased nausea and vomiting etc. Please continue with Aleve and/or ibuprofen around the clock supplemented with Tylenol. Please drink plenty of fluids. Contact the urologist on Monday for follow-up as soon as possible  Please return immediately if condition worsens. Please contact her primary physician or the physician you were given for referral. If you have any specialist physicians involved in her treatment and plan please also contact them. Thank you for using [facility].

## 2016-08-25 NOTE — ED Triage Notes (Addendum)
Pt states urinating blood and L side back/flank pain that goes across lower abdomen x 1.5 weeks. Tried to be seen here recently but left because wait was too long. Pt denies hx of kidney stones. Denies urgency, but feels like it straining to pee.

## 2016-10-29 ENCOUNTER — Encounter: Payer: Self-pay | Admitting: Emergency Medicine

## 2016-10-29 DIAGNOSIS — N3091 Cystitis, unspecified with hematuria: Secondary | ICD-10-CM | POA: Insufficient documentation

## 2016-10-29 DIAGNOSIS — F17210 Nicotine dependence, cigarettes, uncomplicated: Secondary | ICD-10-CM | POA: Insufficient documentation

## 2016-10-29 LAB — URINALYSIS, COMPLETE (UACMP) WITH MICROSCOPIC
Bilirubin Urine: NEGATIVE
GLUCOSE, UA: NEGATIVE mg/dL
Ketones, ur: 5 mg/dL — AB
Leukocytes, UA: NEGATIVE
NITRITE: NEGATIVE
PH: 6 (ref 5.0–8.0)
PROTEIN: 100 mg/dL — AB
SPECIFIC GRAVITY, URINE: 1.035 — AB (ref 1.005–1.030)
Squamous Epithelial / LPF: NONE SEEN

## 2016-10-29 LAB — COMPREHENSIVE METABOLIC PANEL
ALBUMIN: 4.5 g/dL (ref 3.5–5.0)
ALK PHOS: 80 U/L (ref 38–126)
ALT: 13 U/L — ABNORMAL LOW (ref 17–63)
ANION GAP: 8 (ref 5–15)
AST: 22 U/L (ref 15–41)
BILIRUBIN TOTAL: 0.9 mg/dL (ref 0.3–1.2)
BUN: 15 mg/dL (ref 6–20)
CO2: 28 mmol/L (ref 22–32)
Calcium: 8.9 mg/dL (ref 8.9–10.3)
Chloride: 104 mmol/L (ref 101–111)
Creatinine, Ser: 1.36 mg/dL — ABNORMAL HIGH (ref 0.61–1.24)
GFR calc non Af Amer: >60 mL/min (ref >60)
GLUCOSE: 100 mg/dL — AB (ref 65–99)
Potassium: 3.3 mmol/L — ABNORMAL LOW (ref 3.5–5.1)
Sodium: 140 mmol/L (ref 135–145)
TOTAL PROTEIN: 7 g/dL (ref 6.5–8.1)

## 2016-10-29 LAB — CBC
HCT: 38 % — ABNORMAL LOW (ref 40.0–52.0)
HEMOGLOBIN: 12.2 g/dL — AB (ref 13.0–18.0)
MCH: 19.6 pg — AB (ref 26.0–34.0)
MCHC: 32.2 g/dL (ref 32.0–36.0)
MCV: 60.8 fL — ABNORMAL LOW (ref 80.0–100.0)
PLATELETS: 316 10*3/uL (ref 150–440)
RBC: 6.24 MIL/uL — ABNORMAL HIGH (ref 4.40–5.90)
RDW: 14.9 % — ABNORMAL HIGH (ref 11.5–14.5)
WBC: 10.4 10*3/uL (ref 3.8–10.6)

## 2016-10-29 NOTE — ED Triage Notes (Signed)
Patient with complaint of lower abdominal pain, back pain, pain with urination and blood in his urine. Patient states that he has had the pain over a week but has become worse over the last 3 days. Patient states that he has been seen here for the same in the past and was told to follow up with urology. Patient states that he was unable to follow up because he did not have the money.

## 2016-10-30 ENCOUNTER — Emergency Department
Admission: EM | Admit: 2016-10-30 | Discharge: 2016-10-30 | Disposition: A | Discharge status: Home / Self Care | Payer: Self-pay | Attending: Emergency Medicine | Admitting: Emergency Medicine

## 2016-10-30 ENCOUNTER — Emergency Department: Payer: Self-pay

## 2016-10-30 DIAGNOSIS — R310 Gross hematuria: Secondary | ICD-10-CM

## 2016-10-30 DIAGNOSIS — N3090 Cystitis, unspecified without hematuria: Secondary | ICD-10-CM

## 2016-10-30 MED ORDER — CEPHALEXIN 500 MG PO CAPS: 500.0000 mg | ORAL_CAPSULE | Freq: Four times a day (QID) | ORAL | 0 refills | Status: AC

## 2016-10-30 MED ORDER — CEPHALEXIN 500 MG PO CAPS
500.0000 mg | ORAL_CAPSULE | Freq: Once | ORAL | Status: AC
  Administered 2016-10-30: 500 mg via ORAL
  Filled 2016-10-30: qty 1

## 2016-10-30 MED ORDER — PHENAZOPYRIDINE HCL 100 MG PO TABS
100.0000 mg | ORAL_TABLET | Freq: Once | ORAL | Status: AC
  Administered 2016-10-30: 100 mg via ORAL
  Filled 2016-10-30: qty 1

## 2016-10-30 MED ORDER — IBUPROFEN 800 MG PO TABS
800.0000 mg | ORAL_TABLET | Freq: Once | ORAL | Status: AC
  Administered 2016-10-30: 800 mg via ORAL
  Filled 2016-10-30: qty 1

## 2016-10-30 MED ORDER — ETODOLAC 200 MG PO CAPS: 200.0000 mg | ORAL_CAPSULE | Freq: Three times a day (TID) | ORAL | 0 refills | Status: AC

## 2016-10-30 NOTE — ED Provider Notes (Signed)
[facility] Emergency Department Provider Note   ____________________________________________   First MD Initiated Contact with Patient 10/30/16 0236     (approximate)  I have reviewed the triage vital signs and the nursing notes.   HISTORY  Chief Complaint Hematuria and Abdominal Pain    HPI Craig Ellis is a 31 y.o. male who comes into the hospital today with some belly pain, back pain and urinating blood. The patient reports that he was here in January for the same thing. He states that after some time it didn't go away on its own. He reports that it started again last week. He is also having some pain in his testicles. The patient states that he followed up with urology and they said that nothing could be done because the symptoms were improved. The patient reports that he is in a lot of pain. He states that he didn't want to come in but his girlfriend made him come. The patient received a Percocet from his aunt today because he was in so much pain. He reports that he had been not taking any thing else at home. He's had no fevers but has had nausea. He rates his pain 8 out of 10 in intensity. The patient reports that he doesn't have insurance so he could not follow-up with urology again. He is here today for evaluation.   Past Medical History:  Diagnosis Date  . Cervical disc herniation   . Degenerative disc disease, cervical   . Degenerative disc disease, cervical   . Fracture    T11 and T 12 fracture august 2016    There are no active problems to display for this patient.   Past Surgical History:  Procedure Laterality Date  . CERVICAL FUSION      Prior to Admission medications   Medication Sig Start Date End Date Taking? Authorizing Provider  acetaminophen (TYLENOL) 500 MG tablet Take 2,000 mg by mouth every 6 (six) hours as needed. For pain/headache.    Historical Provider, MD  baclofen (LIORESAL) 10 MG tablet Take 1 tablet (10 mg total)  by mouth 3 (three) times daily. 07/05/16 07/05/17  Chinita Pester, FNP  cephALEXin (KEFLEX) 500 MG capsule Take 1 capsule (500 mg total) by mouth 4 (four) times daily. 10/30/16 11/09/16  Rebecka Apley, MD  etodolac (LODINE) 200 MG capsule Take 1 capsule (200 mg total) by mouth every 8 (eight) hours. 10/30/16   Rebecka Apley, MD  HYDROmorphone (DILAUDID) 2 MG tablet Take 1 tablet (2 mg total) by mouth every 12 (twelve) hours as needed for severe pain. 08/25/16   Jennye Moccasin, MD  naproxen (NAPROSYN) 500 MG tablet Take 1 tablet (500 mg total) by mouth 2 (two) times daily with a meal. 11/01/15   Tommi Rumps, PA-C  oxyCODONE-acetaminophen (PERCOCET) 5-325 MG tablet Take 2 tablets by mouth every 6 (six) hours as needed for moderate pain or severe pain. 12/21/15   Emily Filbert, MD  sulfamethoxazole-trimethoprim (BACTRIM DS) 800-160 MG tablet Take 1 tablet by mouth 2 (two) times daily. 12/21/15   Emily Filbert, MD    Allergies Flexeril [cyclobenzaprine]; Toradol [ketorolac tromethamine]; and Tramadol  No family history on file.  Social History Social History  Substance Use Topics  . Smoking status: Current Every Day Smoker    Packs/day: 1.00    Types: Cigarettes  . Smokeless tobacco: Never Used  . Alcohol use No    Review of Systems Constitutional: No fever/chills Eyes:  No visual changes. ENT: No sore throat. Cardiovascular: Denies chest pain. Respiratory: Denies shortness of breath. Gastrointestinal:  abdominal pain, nausea, no vomiting.  No diarrhea.  No constipation. Genitourinary: hematuria Musculoskeletal: back pain. Skin: Negative for rash. Neurological: Negative for headaches, focal weakness or numbness.  10-point ROS otherwise negative.  ____________________________________________   PHYSICAL EXAM:  VITAL SIGNS: ED Triage Vitals  Enc Vitals Group     BP 10/29/16 2256 128/75     Pulse Rate 10/29/16 2256 (!) 105     Resp 10/29/16 2256 18     Temp  10/29/16 2256 98.3 F (36.8 C)     Temp Source 10/29/16 2256 Oral     SpO2 10/29/16 2256 98 %     Weight 10/29/16 2256 130 lb (59 kg)     Height 10/29/16 2256  (1.651 m)     Head Circumference --      Peak Flow --      Pain Score 10/29/16 2254 8     Pain Loc --      Pain Edu? --      Excl. in GC? --     Constitutional: Alert and oriented. Well appearing and in moderate distress. Eyes: Conjunctivae are normal. PERRL. EOMI. Head: Atraumatic. Nose: No congestion/rhinnorhea. Mouth/Throat: Mucous membranes are moist.  Oropharynx non-erythematous. Cardiovascular: Normal rate, regular rhythm. Grossly normal heart sounds.  Good peripheral circulation. Respiratory: Normal respiratory effort.  No retractions. Lungs CTAB. Gastrointestinal: Soft with some lower abd pain to palpation. No distention. Positive bowel sounds, +CVA tenderness on left Musculoskeletal: No lower extremity tenderness nor edema.   Neurologic:  Normal speech and language.  Skin:  Skin is warm, dry and intact.  Psychiatric: Mood and affect are normal.   ____________________________________________   LABS (all labs ordered are listed, but only abnormal results are displayed)  Labs Reviewed  COMPREHENSIVE METABOLIC PANEL - Abnormal; Notable for the following:       Result Value   Potassium 3.3 (*)    Glucose, Bld 100 (*)    Creatinine, Ser 1.36 (*)    ALT 13 (*)    All other components within normal limits  CBC - Abnormal; Notable for the following:    RBC 6.24 (*)    Hemoglobin 12.2 (*)    HCT 38.0 (*)    MCV 60.8 (*)    MCH 19.6 (*)    RDW 14.9 (*)    All other components within normal limits  URINALYSIS, COMPLETE (UACMP) WITH MICROSCOPIC - Abnormal; Notable for the following:    Color, Urine AMBER (*)    APPearance CLOUDY (*)    Specific Gravity, Urine 1.035 (*)    Hgb urine dipstick LARGE (*)    Ketones, ur 5 (*)    Protein, ur 100 (*)    Bacteria, UA RARE (*)    All other components within  normal limits  URINE CULTURE   ____________________________________________  EKG  none ____________________________________________  RADIOLOGY  CT renal stone study ____________________________________________   PROCEDURES  Procedure(s) performed: None  Procedures  Critical Care performed: No  ____________________________________________   INITIAL IMPRESSION / ASSESSMENT AND PLAN / ED COURSE  Pertinent labs & imaging results that were available during my care of the patient were reviewed by me and considered in my medical decision making (see chart for details).  This is a 31 year old male who comes into the hospital today with some hematuria and abdominal pain as well as some back pain. The patient has been seen  multiple times in multiple emergency departments with the same complaint. The patient was seen at [facility] on January 15 at [facility], January 26 in February 13. I will send the patient to receive a CT scan and give him some ibuprofen. He will be reassessed.   Clinical Course as of Oct 31 439  Mon Oct 30, 2016  0357 1. No nephrolithiasis nor obstructive uropathy. No findings on this unenhanced CT to explain the patient's hematuria. 2. Degenerative disc disease T10 through L1 most marked at T11-12. 3. No significant change from the study of 08/25/2016.   CT Renal [facility] [AW]    Clinical Course User Index [AW] Rebecka Apley, MD   The patient's CT scan does not show any kidney stones. I went and discussed the results with the patient. I informed him that I can treat him with Keflex as if he has an infection. The patient reports that they did that last time he was here. I informed him again that we're not the specialist and we can only do what is typical to treat the symptoms until he can follow-up. The patient reports that he received a steroid that helped his bleeding but looking through his notes a did not see any steroids  that were given to him. I also asked him about his visits to [facility] and he reports that he was not there in February and he doesn't know what I'm talking about. I again encouraged the patient to follow-up with urology so they could further evaluate the cause of this bleeding that he has. The patient's CT scan otherwise is unremarkable. He will be discharged home to follow-up with urology.  ____________________________________________   FINAL CLINICAL IMPRESSION(S) / ED DIAGNOSES  Final diagnoses:  Gross hematuria  Cystitis      NEW MEDICATIONS STARTED DURING THIS VISIT:  New Prescriptions   CEPHALEXIN (KEFLEX) 500 MG CAPSULE    Take 1 capsule (500 mg total) by mouth 4 (four) times daily.   ETODOLAC (LODINE) 200 MG CAPSULE    Take 1 capsule (200 mg total) by mouth every 8 (eight) hours.     Note:  This document was prepared using Dragon voice recognition software and may include unintentional dictation errors.    Rebecka Apley, MD 10/30/16 [phone]

## 2016-10-30 NOTE — ED Notes (Signed)
Patient transported to CT 

## 2016-10-30 NOTE — Discharge Instructions (Signed)
Please follow up with urology for further evaluation of your hematuria

## 2016-10-31 LAB — URINE CULTURE: Culture: NO GROWTH

## 2016-11-05 ENCOUNTER — Emergency Department: Payer: Self-pay

## 2016-11-05 ENCOUNTER — Encounter: Payer: Self-pay | Admitting: Emergency Medicine

## 2016-11-05 ENCOUNTER — Emergency Department
Admission: EM | Admit: 2016-11-05 | Discharge: 2016-11-05 | Disposition: A | Discharge status: Home / Self Care | Payer: Self-pay | Attending: Emergency Medicine | Admitting: Emergency Medicine

## 2016-11-05 DIAGNOSIS — R319 Hematuria, unspecified: Secondary | ICD-10-CM | POA: Insufficient documentation

## 2016-11-05 DIAGNOSIS — F17210 Nicotine dependence, cigarettes, uncomplicated: Secondary | ICD-10-CM | POA: Insufficient documentation

## 2016-11-05 DIAGNOSIS — Z79899 Other long term (current) drug therapy: Secondary | ICD-10-CM | POA: Insufficient documentation

## 2016-11-05 DIAGNOSIS — N5082 Scrotal pain: Secondary | ICD-10-CM

## 2016-11-05 LAB — CBC WITH DIFFERENTIAL/PLATELET
BASOS PCT: 1 %
Basophils Absolute: 0.1 10*3/uL (ref 0–0.1)
EOS ABS: 0.2 10*3/uL (ref 0–0.7)
EOS PCT: 2 %
HCT: 38.6 % — ABNORMAL LOW (ref 40.0–52.0)
Hemoglobin: 12.3 g/dL — ABNORMAL LOW (ref 13.0–18.0)
LYMPHS ABS: 2.1 10*3/uL (ref 1.0–3.6)
Lymphocytes Relative: 25 %
MCH: 19.4 pg — AB (ref 26.0–34.0)
MCHC: 31.9 g/dL — ABNORMAL LOW (ref 32.0–36.0)
MCV: 60.6 fL — ABNORMAL LOW (ref 80.0–100.0)
Monocytes Absolute: 0.5 10*3/uL (ref 0.2–1.0)
Monocytes Relative: 7 %
NEUTROS PCT: 65 %
Neutro Abs: 5.5 10*3/uL (ref 1.4–6.5)
PLATELETS: 335 10*3/uL (ref 150–440)
RBC: 6.38 MIL/uL — AB (ref 4.40–5.90)
RDW: 15.4 % — ABNORMAL HIGH (ref 11.5–14.5)
WBC: 8.4 10*3/uL (ref 3.8–10.6)

## 2016-11-05 LAB — URINE DRUG SCREEN, QUALITATIVE (ARMC ONLY)
Amphetamines, Ur Screen: NOT DETECTED
BARBITURATES, UR SCREEN: NOT DETECTED
Benzodiazepine, Ur Scrn: POSITIVE — AB
CANNABINOID 50 NG, UR ~~LOC~~: NOT DETECTED
Cocaine Metabolite,Ur ~~LOC~~: NOT DETECTED
MDMA (ECSTASY) UR SCREEN: NOT DETECTED
Methadone Scn, Ur: NOT DETECTED
Opiate, Ur Screen: NOT DETECTED
PHENCYCLIDINE (PCP) UR S: NOT DETECTED
TRICYCLIC, UR SCREEN: NOT DETECTED

## 2016-11-05 LAB — BASIC METABOLIC PANEL
ANION GAP: 4 — AB (ref 5–15)
BUN: 9 mg/dL (ref 6–20)
CALCIUM: 9.3 mg/dL (ref 8.9–10.3)
CO2: 28 mmol/L (ref 22–32)
Chloride: 105 mmol/L (ref 101–111)
Creatinine, Ser: 0.67 mg/dL (ref 0.61–1.24)
GFR calc Af Amer: >60 mL/min (ref >60)
Glucose, Bld: 107 mg/dL — ABNORMAL HIGH (ref 65–99)
POTASSIUM: 4.4 mmol/L (ref 3.5–5.1)
SODIUM: 137 mmol/L (ref 135–145)

## 2016-11-05 LAB — URINALYSIS, COMPLETE (UACMP) WITH MICROSCOPIC
BILIRUBIN URINE: NEGATIVE
Bacteria, UA: NONE SEEN
GLUCOSE, UA: NEGATIVE mg/dL
KETONES UR: NEGATIVE mg/dL
LEUKOCYTES UA: NEGATIVE
Nitrite: NEGATIVE
PROTEIN: 30 mg/dL — AB
Specific Gravity, Urine: 1.025 (ref 1.005–1.030)
Squamous Epithelial / LPF: NONE SEEN
pH: 5 (ref 5.0–8.0)

## 2016-11-05 MED ORDER — OXYCODONE-ACETAMINOPHEN 5-325 MG PO TABS
1.0000 | ORAL_TABLET | Freq: Once | ORAL | Status: AC
  Administered 2016-11-05: 1 via ORAL
  Filled 2016-11-05: qty 1

## 2016-11-05 NOTE — ED Notes (Signed)
Pt walked out of room to bathroom muttering under his breath and cursing; per Dr Pershing Proud pt requested oral Dilaudid and was told no; pt back to room and continues to speak loudly to himself, cursing;

## 2016-11-05 NOTE — ED Notes (Signed)
In to discharge pt home with instructions, he was already dressed with coat on; pt did not want to discuss any paperwork; says "you people take too f%% long"; pt refused vital signs; when told I needed to pull his IV pt said he already pulled it and walked out; told this nurse to "F&&& off" and then gave me the middle finger; pt would not let this nurse see his arm to insure IV was pulled or put dressing on site; followed pt to lobby to ask again about IV, he proceeded to mutter under his breath and continue walking; he then was seen throwing his discharge papers in the trash can outside ED entrance; searched room and bathroom for saline lock but couldn't locate;

## 2016-11-05 NOTE — ED Provider Notes (Signed)
[facility] Emergency Department Provider Note  ____________________________________________   First MD Initiated Contact with Patient 11/05/16 1616     (approximate)  I have reviewed the triage vital signs and the nursing notes.   HISTORY  Chief Complaint Hematuria   HPI Craig Ellis is a 31 y.o. male with a history of previous C-spine surgerywho is presenting with lower abdominal pain, penile as well as testicular pain and hematuria. He says that he has had intermittent hematuria ever since this past December. He says that he has had a course of antibiotics without resolution. Denies any trauma. Says that it feels like someone is pulling his testicles and penis at this time and the pain is moderate to severe. Has not seen neurology. Says that he cannot afford to see urology in the office. Has been to the emergency department multiple times with hematuria found but with other studies being reassuring such as CAT scan. He says that he was also recently tested for STDs [facility] and came back negative. Says the pain and bleeding of worsening over the past several days.   Past Medical History:  Diagnosis Date  . Cervical disc herniation   . Degenerative disc disease, cervical   . Degenerative disc disease, cervical   . Fracture    T11 and T 12 fracture august 2016    There are no active problems to display for this patient.   Past Surgical History:  Procedure Laterality Date  . CERVICAL FUSION      Prior to Admission medications   Medication Sig Start Date End Date Taking? Authorizing Provider  acetaminophen (TYLENOL) 500 MG tablet Take 2,000 mg by mouth every 6 (six) hours as needed. For pain/headache.    Historical Provider, MD  baclofen (LIORESAL) 10 MG tablet Take 1 tablet (10 mg total) by mouth 3 (three) times daily. 07/05/16 07/05/17  Chinita Pester, FNP  cephALEXin (KEFLEX) 500 MG capsule Take 1 capsule (500 mg total) by  mouth 4 (four) times daily. 10/30/16 11/09/16  Rebecka Apley, MD  etodolac (LODINE) 200 MG capsule Take 1 capsule (200 mg total) by mouth every 8 (eight) hours. 10/30/16   Rebecka Apley, MD  HYDROmorphone (DILAUDID) 2 MG tablet Take 1 tablet (2 mg total) by mouth every 12 (twelve) hours as needed for severe pain. 08/25/16   Jennye Moccasin, MD  naproxen (NAPROSYN) 500 MG tablet Take 1 tablet (500 mg total) by mouth 2 (two) times daily with a meal. 11/01/15   Tommi Rumps, PA-C  oxyCODONE-acetaminophen (PERCOCET) 5-325 MG tablet Take 2 tablets by mouth every 6 (six) hours as needed for moderate pain or severe pain. 12/21/15   Emily Filbert, MD  sulfamethoxazole-trimethoprim (BACTRIM DS) 800-160 MG tablet Take 1 tablet by mouth 2 (two) times daily. 12/21/15   Emily Filbert, MD    Allergies Flexeril [cyclobenzaprine]; Toradol [ketorolac tromethamine]; and Tramadol  No family history on file.  Social History Social History  Substance Use Topics  . Smoking status: Current Every Day Smoker    Packs/day: 1.00    Types: Cigarettes  . Smokeless tobacco: Never Used  . Alcohol use No    Review of Systems Constitutional: No fever/chills Eyes: No visual changes. ENT: No sore throat. Cardiovascular: Denies chest pain. Respiratory: Denies shortness of breath. Gastrointestinal:  No nausea, no vomiting.  No diarrhea.  No constipation. Genitourinary: Negative for dysuria. Musculoskeletal: Negative for back pain. Skin: Negative for rash. Neurological:  Negative for headaches, focal weakness or numbness.  10-point ROS otherwise negative.  ____________________________________________   PHYSICAL EXAM:  VITAL SIGNS: ED Triage Vitals  Enc Vitals Group     BP 11/05/16 1417 115/74     Pulse Rate 11/05/16 1417 91     Resp 11/05/16 1417 16     Temp 11/05/16 1417 98.1 F (36.7 C)     Temp Source 11/05/16 1417 Oral     SpO2 11/05/16 1417 99 %     Weight --      Height --       Head Circumference --      Peak Flow --      Pain Score 11/05/16 1408 7     Pain Loc --      Pain Edu? --      Excl. in GC? --     Constitutional: Alert and oriented. Appears uncomfortable.   Eyes: Conjunctivae are normal. PERRL. EOMI. Head: Atraumatic. Nose: No congestion/rhinnorhea. Mouth/Throat: Mucous membranes are moist.   Neck: No stridor.   Cardiovascular: Normal rate, regular rhythm. Grossly normal heart sounds.  Respiratory: Normal respiratory effort.  No retractions. Lungs CTAB. Gastrointestinal: Soft with mild/moderate suprapubic tenderness to palpation without rebound or guarding. No distention. No CVA tenderness. Genitourinary: Normal external appearance. Tenderness palpation diffusely to the testicles. No penile tenderness palpation. No masses palpated testicles. No horizontal lie of the testicles or swelling. Patient is circumcised. Musculoskeletal: No lower extremity tenderness nor edema.  No joint effusions. Neurologic:  Normal speech and language. No gross focal neurologic deficits are appreciated. No gait instability. Skin:  Skin is warm, dry and intact. No rash noted. Psychiatric: Mood and affect are normal. Speech and behavior are normal.  ____________________________________________   LABS (all labs ordered are listed, but only abnormal results are displayed)  Labs Reviewed  URINALYSIS, COMPLETE (UACMP) WITH MICROSCOPIC - Abnormal; Notable for the following:       Result Value   Color, Urine YELLOW (*)    APPearance CLOUDY (*)    Hgb urine dipstick LARGE (*)    Protein, ur 30 (*)    All other components within normal limits  URINE DRUG SCREEN, QUALITATIVE ([facility] ONLY) - Abnormal; Notable for the following:    Benzodiazepine, Ur Scrn POSITIVE (*)    All other components within normal limits  CBC WITH DIFFERENTIAL/PLATELET - Abnormal; Notable for the following:    RBC 6.38 (*)    Hemoglobin 12.3 (*)    HCT 38.6 (*)    MCV 60.6 (*)    MCH 19.4 (*)     MCHC 31.9 (*)    RDW 15.4 (*)    All other components within normal limits  BASIC METABOLIC PANEL - Abnormal; Notable for the following:    Glucose, Bld 107 (*)    Anion gap 4 (*)    All other components within normal limits  URINE CULTURE   ____________________________________________  EKG   ____________________________________________  RADIOLOGY   [place] Art/Ven Flow Abd Pelv Doppler (Final result)  Result time 11/05/16 18:44:53  Final result by Charline Bills, MD (11/05/16 18:44:53)           Narrative:   CLINICAL DATA: Intermittent bilateral scrotal pain  EXAM: SCROTAL ULTRASOUND  DOPPLER ULTRASOUND OF THE TESTICLES  TECHNIQUE: Complete ultrasound examination of the testicles, epididymis, and other scrotal structures was performed. Color and spectral Doppler ultrasound were also utilized to evaluate blood flow to the testicles.  COMPARISON: None.  FINDINGS: Right testicle  Measurements:  4.8 x 2.2 x 3.1 cm. No mass or microlithiasis visualized.  Left testicle  Measurements: 4.4 x 1.9 x 3.1 cm. No mass or microlithiasis visualized.  Right epididymis: Normal in size and appearance.  Left epididymis: Normal in size and appearance.  Hydrocele: None visualized.  Varicocele: None visualized.  Pulsed Doppler interrogation of both testes demonstrates normal low resistance arterial and venous waveforms bilaterally.  IMPRESSION: Negative scrotal ultrasound.  No evidence of testicular torsion.   Electronically Signed By: Charline Bills M.D. On: 11/05/2016 18:44          ____________________________________________   PROCEDURES  Procedure(s) performed:   Procedures  Critical Care performed:   ____________________________________________   INITIAL IMPRESSION / ASSESSMENT AND PLAN / ED COURSE  Pertinent labs & imaging results that were available during my care of the patient were reviewed by me and considered in my medical  decision making (see chart for details).  ----------------------------------------- 7:38 PM on 11/05/2016 -----------------------------------------  Patient without any distress. Reassuring labs as well as ultrasound. Does not appear to be an emergent condition. Told patient that it is suggested that he follow-up in the urology office but he says it has been cost prohibitive for him. We discussed follow-up at [facility] urology where there is a system free care program. He is amenable to this. I offered him oxybutynin for pain control and he says this has not worked in the past. He then asked for "hydromorphone." I told him that I would not be able to give him this type of pain medication for appears to be a chronic condition. At this point the patient became very angry and began cursing. At the time of discharge he was very hostile to the nurse and threw out his discharge papers and the trash and then gave the nurse the middle finger.     ____________________________________________   FINAL CLINICAL IMPRESSION(S) / ED DIAGNOSES  Final diagnoses:  Hematuria, unspecified type      NEW MEDICATIONS STARTED DURING THIS VISIT:  New Prescriptions   No medications on file     Note:  This document was prepared using Dragon voice recognition software and may include unintentional dictation errors.    Myrna Blazer, MD 11/05/16 [phone]

## 2016-11-05 NOTE — ED Triage Notes (Signed)
Patient presents to the ED with hematuria intermittently since January.  Patient states it has been fairly consistent for about 1 week.  Patient reports lower pelvic pain.  Denies flank pain.  Patient reports he was seen in the ED last week for the same and was put on antibiotics but patient states hematuria has not subsided.

## 2016-11-07 LAB — URINE CULTURE
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
## 2019-04-23 ENCOUNTER — Emergency Department
Admission: EM | Admit: 2019-04-23 | Discharge: 2019-04-23 | Disposition: A | Discharge status: Home / Self Care | Payer: Self-pay | Attending: Emergency Medicine | Admitting: Emergency Medicine

## 2019-04-23 ENCOUNTER — Other Ambulatory Visit: Payer: Self-pay

## 2019-04-23 ENCOUNTER — Encounter: Payer: Self-pay | Admitting: Emergency Medicine

## 2019-04-23 DIAGNOSIS — Z5321 Procedure and treatment not carried out due to patient leaving prior to being seen by health care provider: Secondary | ICD-10-CM | POA: Insufficient documentation

## 2019-04-23 LAB — CBC WITH DIFFERENTIAL/PLATELET
Abs Immature Granulocytes: 0.08 10*3/uL — ABNORMAL HIGH (ref 0.00–0.07)
Basophils Absolute: 0.1 10*3/uL (ref 0.0–0.1)
Basophils Relative: 0 %
Eosinophils Absolute: 0.2 10*3/uL (ref 0.0–0.5)
Eosinophils Relative: 1 %
HCT: 31.4 % — ABNORMAL LOW (ref 39.0–52.0)
Hemoglobin: 9.9 g/dL — ABNORMAL LOW (ref 13.0–17.0)
Immature Granulocytes: 1 %
Lymphocytes Relative: 14 %
Lymphs Abs: 2.3 10*3/uL (ref 0.7–4.0)
MCH: 18.9 pg — ABNORMAL LOW (ref 26.0–34.0)
MCHC: 31.5 g/dL (ref 30.0–36.0)
MCV: 60 fL — ABNORMAL LOW (ref 80.0–100.0)
Monocytes Absolute: 1.1 10*3/uL — ABNORMAL HIGH (ref 0.1–1.0)
Monocytes Relative: 7 %
Neutro Abs: 12.9 10*3/uL — ABNORMAL HIGH (ref 1.7–7.7)
Neutrophils Relative %: 77 %
Platelets: 344 10*3/uL (ref 150–400)
RBC: 5.23 MIL/uL (ref 4.22–5.81)
RDW: 15.3 % (ref 11.5–15.5)
WBC: 16.7 10*3/uL — ABNORMAL HIGH (ref 4.0–10.5)
nRBC: 0 % (ref 0.0–0.2)

## 2019-04-23 LAB — COMPREHENSIVE METABOLIC PANEL
ALT: 46 U/L — ABNORMAL HIGH (ref 0–44)
AST: 34 U/L (ref 15–41)
Albumin: 4.3 g/dL (ref 3.5–5.0)
Alkaline Phosphatase: 91 U/L (ref 38–126)
Anion gap: 9 (ref 5–15)
BUN: 12 mg/dL (ref 6–20)
CO2: 26 mmol/L (ref 22–32)
Calcium: 9.3 mg/dL (ref 8.9–10.3)
Chloride: 99 mmol/L (ref 98–111)
Creatinine, Ser: 0.68 mg/dL (ref 0.61–1.24)
GFR calc Af Amer: >60 mL/min (ref >60)
GFR calc non Af Amer: >60 mL/min (ref >60)
Glucose, Bld: 122 mg/dL — ABNORMAL HIGH (ref 70–99)
Potassium: 4.3 mmol/L (ref 3.5–5.1)
Sodium: 134 mmol/L — ABNORMAL LOW (ref 135–145)
Total Bilirubin: 1.5 mg/dL — ABNORMAL HIGH (ref 0.3–1.2)
Total Protein: 7.9 g/dL (ref 6.5–8.1)

## 2019-04-23 LAB — LIPASE, BLOOD: Lipase: 24 U/L (ref 11–51)
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
## 2019-04-23 NOTE — ED Triage Notes (Addendum)
Pt to triage via w/c, jerky upper extremity movements noted; st few hrs ago began having mid lower abd pain radiating over to rt side with no accomp symptoms; st feels as if someone is pulling on his scrotum; pt has been seen for same previously but when asked regarding such, st this pain is not the same; denies hx of same; pt with multiple small sores noted to face & arms

## 2019-04-24 ENCOUNTER — Telehealth: Payer: Self-pay | Admitting: Emergency Medicine
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
## 2019-07-19 ENCOUNTER — Other Ambulatory Visit: Payer: Self-pay

## 2019-07-19 ENCOUNTER — Emergency Department
Admission: EM | Admit: 2019-07-19 | Discharge: 2019-07-19 | Disposition: A | Discharge status: Home / Self Care | Payer: Self-pay | Attending: Emergency Medicine | Admitting: Emergency Medicine

## 2019-07-19 DIAGNOSIS — W312XXA Contact with powered woodworking and forming machines, initial encounter: Secondary | ICD-10-CM | POA: Insufficient documentation

## 2019-07-19 DIAGNOSIS — S61011A Laceration without foreign body of right thumb without damage to nail, initial encounter: Secondary | ICD-10-CM | POA: Insufficient documentation

## 2019-07-19 DIAGNOSIS — Y929 Unspecified place or not applicable: Secondary | ICD-10-CM | POA: Insufficient documentation

## 2019-07-19 DIAGNOSIS — Y999 Unspecified external cause status: Secondary | ICD-10-CM | POA: Insufficient documentation

## 2019-07-19 DIAGNOSIS — Z5321 Procedure and treatment not carried out due to patient leaving prior to being seen by health care provider: Secondary | ICD-10-CM | POA: Insufficient documentation

## 2019-07-19 DIAGNOSIS — Y939 Activity, unspecified: Secondary | ICD-10-CM | POA: Insufficient documentation
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
## 2020-05-01 ENCOUNTER — Emergency Department
Admission: EM | Admit: 2020-05-01 | Discharge: 2020-05-01 | Disposition: A | Discharge status: Home / Self Care | Attending: Emergency Medicine | Admitting: Emergency Medicine

## 2020-05-01 ENCOUNTER — Emergency Department

## 2020-05-01 ENCOUNTER — Other Ambulatory Visit: Payer: Self-pay

## 2020-05-01 ENCOUNTER — Encounter: Payer: Self-pay | Admitting: Emergency Medicine

## 2020-05-01 DIAGNOSIS — F17210 Nicotine dependence, cigarettes, uncomplicated: Secondary | ICD-10-CM | POA: Diagnosis not present

## 2020-05-01 DIAGNOSIS — S0081XA Abrasion of other part of head, initial encounter: Secondary | ICD-10-CM

## 2020-05-01 DIAGNOSIS — S0101XA Laceration without foreign body of scalp, initial encounter: Secondary | ICD-10-CM | POA: Insufficient documentation

## 2020-05-01 DIAGNOSIS — Y92149 Unspecified place in prison as the place of occurrence of the external cause: Secondary | ICD-10-CM | POA: Insufficient documentation

## 2020-05-01 DIAGNOSIS — S0990XA Unspecified injury of head, initial encounter: Secondary | ICD-10-CM

## 2020-05-01 DIAGNOSIS — R519 Headache, unspecified: Secondary | ICD-10-CM | POA: Diagnosis present

## 2020-05-01 DIAGNOSIS — Y9389 Activity, other specified: Secondary | ICD-10-CM | POA: Diagnosis not present

## 2020-05-01 DIAGNOSIS — Z23 Encounter for immunization: Secondary | ICD-10-CM | POA: Diagnosis not present

## 2020-05-01 DIAGNOSIS — S0083XA Contusion of other part of head, initial encounter: Secondary | ICD-10-CM

## 2020-05-01 MED ORDER — HYDROCODONE-ACETAMINOPHEN 5-325 MG PO TABS
1.0000 | ORAL_TABLET | Freq: Once | ORAL | Status: AC
  Administered 2020-05-01: 17:00:00 1 via ORAL
  Filled 2020-05-01: qty 1

## 2020-05-01 MED ORDER — TETANUS-DIPHTH-ACELL PERTUSSIS 5-2.5-18.5 LF-MCG/0.5 IM SUSP
0.5000 mL | Freq: Once | INTRAMUSCULAR | Status: AC
  Administered 2020-05-01: 17:00:00 0.5 mL via INTRAMUSCULAR
  Filled 2020-05-01: qty 0.5
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
## 2020-05-01 NOTE — Discharge Instructions (Signed)
Return emergency department worsening. Patient should have Tylenol every 6 hours for pain as needed. Wash the wounds daily to prevent infection Return emergency department for any sign of infection on the wound Tdap was updated today

## 2020-05-01 NOTE — ED Notes (Signed)
Discussed with dr Darnelle Catalan, orders placed.
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
## 2024-06-09 ENCOUNTER — Emergency Department: Admission: EM | Admit: 2024-06-09 | Discharge: 2024-06-09

## 2024-06-09 NOTE — ED Notes (Signed)
 Pt refusing to see DR
